# Patient Record
Sex: Male | Born: 1935 | Race: White | Hispanic: No | Marital: Single | Smoking: Never smoker
Health system: Southern US, Community
[De-identification: ages and names within clinical notes are randomized; demographics above are authoritative.]

## PROBLEM LIST (undated history)

## (undated) DIAGNOSIS — J310 Chronic rhinitis: Secondary | ICD-10-CM

## (undated) DIAGNOSIS — G309 Alzheimer's disease, unspecified: Secondary | ICD-10-CM

## (undated) DIAGNOSIS — N401 Enlarged prostate with lower urinary tract symptoms: Secondary | ICD-10-CM

## (undated) DIAGNOSIS — K219 Gastro-esophageal reflux disease without esophagitis: Secondary | ICD-10-CM

## (undated) DIAGNOSIS — M75122 Complete rotator cuff tear or rupture of left shoulder, not specified as traumatic: Secondary | ICD-10-CM

## (undated) DIAGNOSIS — R110 Nausea: Secondary | ICD-10-CM

## (undated) DIAGNOSIS — N138 Other obstructive and reflux uropathy: Secondary | ICD-10-CM

## (undated) DIAGNOSIS — F0280 Dementia in other diseases classified elsewhere without behavioral disturbance: Secondary | ICD-10-CM

## (undated) DIAGNOSIS — H8110 Benign paroxysmal vertigo, unspecified ear: Secondary | ICD-10-CM

## (undated) HISTORY — DX: Nausea: R11.0

## (undated) HISTORY — DX: Other obstructive and reflux uropathy: N13.8

## (undated) HISTORY — DX: Alzheimer's disease, unspecified: G30.9

## (undated) HISTORY — DX: Benign paroxysmal vertigo, unspecified ear: H81.10

## (undated) HISTORY — DX: Dementia in other diseases classified elsewhere without behavioral disturbance: F02.80

## (undated) HISTORY — DX: Chronic rhinitis: J31.0

## (undated) HISTORY — DX: Complete rotator cuff tear or rupture of left shoulder, not specified as traumatic: M75.122

## (undated) HISTORY — DX: Benign prostatic hyperplasia with lower urinary tract symptoms: N40.1

## (undated) HISTORY — DX: Gastro-esophageal reflux disease without esophagitis: K21.9

---

## 2011-01-03 ENCOUNTER — Ambulatory Visit: Payer: Self-pay | Admitting: Gastroenterology

## 2011-11-20 ENCOUNTER — Emergency Department: Payer: Self-pay | Admitting: Emergency Medicine

## 2012-03-21 ENCOUNTER — Emergency Department: Payer: Self-pay | Admitting: Emergency Medicine

## 2012-03-23 LAB — BETA STREP CULTURE(ARMC)

## 2012-10-30 ENCOUNTER — Observation Stay: Payer: Self-pay | Admitting: Internal Medicine

## 2012-10-30 LAB — COMPREHENSIVE METABOLIC PANEL
Albumin: 3.7 g/dL (ref 3.4–5.0)
Alkaline Phosphatase: 73 U/L (ref 50–136)
Anion Gap: 7 (ref 7–16)
BUN: 13 mg/dL (ref 7–18)
Bilirubin,Total: 0.5 mg/dL (ref 0.2–1.0)
Co2: 27 mmol/L (ref 21–32)
Creatinine: 0.9 mg/dL (ref 0.60–1.30)
EGFR (Non-African Amer.): >60
Glucose: 161 mg/dL — ABNORMAL HIGH (ref 65–99)
Osmolality: 270 (ref 275–301)
SGOT(AST): 17 U/L (ref 15–37)
Sodium: 133 mmol/L — ABNORMAL LOW (ref 136–145)
Total Protein: 6.8 g/dL (ref 6.4–8.2)

## 2012-10-30 LAB — TROPONIN I: Troponin-I: 0.1 ng/mL — ABNORMAL HIGH

## 2012-10-30 LAB — CK TOTAL AND CKMB (NOT AT ARMC)
CK, Total: 43 U/L (ref 35–232)
CK-MB: 1.9 ng/mL (ref 0.5–3.6)
CK-MB: 1.8 ng/mL (ref 0.5–3.6)

## 2012-10-30 LAB — CBC WITH DIFFERENTIAL/PLATELET
Eosinophil %: 0.3 %
HCT: 39.2 % — ABNORMAL LOW (ref 40.0–52.0)
MCH: 34.2 pg — ABNORMAL HIGH (ref 26.0–34.0)
MCV: 97 fL (ref 80–100)
Monocyte #: 0.3 x10 3/mm (ref 0.2–1.0)
Monocyte %: 4.4 %
Neutrophil #: 6.2 10*3/uL (ref 1.4–6.5)
RDW: 12.9 % (ref 11.5–14.5)
WBC: 7.6 10*3/uL (ref 3.8–10.6)

## 2012-10-31 LAB — BASIC METABOLIC PANEL
Anion Gap: 6 — ABNORMAL LOW (ref 7–16)
BUN: 10 mg/dL (ref 7–18)
Chloride: 101 mmol/L (ref 98–107)
Co2: 27 mmol/L (ref 21–32)
Creatinine: 0.69 mg/dL (ref 0.60–1.30)
Osmolality: 267 (ref 275–301)
Sodium: 134 mmol/L — ABNORMAL LOW (ref 136–145)

## 2012-10-31 LAB — CBC WITH DIFFERENTIAL/PLATELET
Basophil #: 0 10*3/uL (ref 0.0–0.1)
HGB: 13.7 g/dL (ref 13.0–18.0)
Lymphocyte #: 1.3 10*3/uL (ref 1.0–3.6)
MCH: 33.6 pg (ref 26.0–34.0)
MCV: 95 fL (ref 80–100)
Monocyte #: 0.6 x10 3/mm (ref 0.2–1.0)
Platelet: 175 10*3/uL (ref 150–440)
RDW: 12.8 % (ref 11.5–14.5)
WBC: 7.1 10*3/uL (ref 3.8–10.6)

## 2012-10-31 LAB — LIPID PANEL
Cholesterol: 141 mg/dL (ref 0–200)
HDL Cholesterol: 55 mg/dL (ref 40–60)
Triglycerides: 77 mg/dL (ref 0–200)

## 2012-10-31 LAB — URINALYSIS, COMPLETE
Bacteria: NONE SEEN
Glucose,UR: NEGATIVE mg/dL (ref 0–75)
Nitrite: NEGATIVE
Ph: 7 (ref 4.5–8.0)
Protein: NEGATIVE
RBC,UR: NONE SEEN /HPF (ref 0–5)
Squamous Epithelial: NONE SEEN

## 2012-10-31 LAB — CK TOTAL AND CKMB (NOT AT ARMC)
CK, Total: 61 U/L (ref 35–232)
CK-MB: 2.9 ng/mL (ref 0.5–3.6)

## 2012-10-31 LAB — TSH: Thyroid Stimulating Horm: 6.14 u[IU]/mL — ABNORMAL HIGH

## 2012-10-31 LAB — T4, FREE: Free Thyroxine: 1.04 ng/dL (ref 0.76–1.46)

## 2013-03-10 ENCOUNTER — Ambulatory Visit: Payer: Self-pay | Admitting: Orthopedic Surgery

## 2013-03-10 LAB — CBC WITH DIFFERENTIAL/PLATELET
Basophil #: 0 10*3/uL (ref 0.0–0.1)
Basophil %: 1.4 %
Eosinophil %: 3.6 %
HCT: 41.3 % (ref 40.0–52.0)
Lymphocyte #: 0.9 10*3/uL — ABNORMAL LOW (ref 1.0–3.6)
Lymphocyte %: 27.5 %
MCH: 34.1 pg — ABNORMAL HIGH (ref 26.0–34.0)
MCHC: 35.1 g/dL (ref 32.0–36.0)
Monocyte #: 0.3 x10 3/mm (ref 0.2–1.0)
Monocyte %: 8.2 %
Neutrophil #: 2 10*3/uL (ref 1.4–6.5)
Neutrophil %: 59.3 %
Platelet: 193 10*3/uL (ref 150–440)
RDW: 12.9 % (ref 11.5–14.5)

## 2013-03-10 LAB — BASIC METABOLIC PANEL
Anion Gap: 4 — ABNORMAL LOW (ref 7–16)
Chloride: 105 mmol/L (ref 98–107)
Co2: 29 mmol/L (ref 21–32)
Creatinine: 0.88 mg/dL (ref 0.60–1.30)
EGFR (African American): >60
Glucose: 114 mg/dL — ABNORMAL HIGH (ref 65–99)
Osmolality: 278 (ref 275–301)
Potassium: 4.1 mmol/L (ref 3.5–5.1)

## 2013-03-11 ENCOUNTER — Ambulatory Visit: Payer: Self-pay | Admitting: Orthopedic Surgery

## 2013-06-03 HISTORY — PX: CARPAL TUNNEL RELEASE: SHX101

## 2014-01-27 ENCOUNTER — Ambulatory Visit: Payer: Self-pay | Admitting: Podiatry

## 2014-08-17 ENCOUNTER — Ambulatory Visit: Payer: Self-pay | Admitting: Ophthalmology

## 2014-08-23 ENCOUNTER — Emergency Department: Payer: Self-pay | Admitting: Emergency Medicine

## 2014-09-23 NOTE — Consult Note (Signed)
Brief Consult Note: Diagnosis: Borderline elevated troponin, no CP, normal ECG, probable demand supply ischemia without MI.   Patient was seen by consultant.   Consult note dictated.   Comments: REC  Agree with current therapy, defer full dose anticoagulation, defer further cardiac diagnostics at this time.  Electronic Signatures: Marcina MillardParaschos, Dacota Ruben (MD)  (Signed 30-May-14 17:09)  Authored: Brief Consult Note   Last Updated: 30-May-14 17:09 by Marcina MillardParaschos, Danasha Melman (MD)

## 2014-09-23 NOTE — Discharge Summary (Signed)
PATIENT NAME:  [name], [name] DATE OF BIRTH:  November 22, 1932  DATE OF ADMISSION:  10/30/2012 DATE OF DISCHARGE:  10/31/2012  ADMITTING PHYSICIAN: Dr. Jacques NavyAhmadzia.  DISCHARGING PHYSICIAN: Cherly Hensenadhika Palisade, M.D.   PATIENT'S PRIMARY MD: Dr. Alonna BucklerAndrew Lamb.   CONSULTATIONS IN THE HOSPITAL: Cardiology consultation by Dr. Darrold JunkerParaschos.   DISCHARGE DIAGNOSES: 1.  NAUSEA AND VOMITING, DEVELOPED AS A SIDE-EFFECT TO TRAMADOL.  2.  Elevated troponin, likely demand ischemia.  3.  Gastroesophageal reflux disease.  4.  Benign prostatic hypertrophy.  5.  Carpal tunnel surgery.   DISCHARGE MEDICATIONS: 1.  Jalyn  0.5 mg/0.4 mg, one capsule p.o. daily.  2.  Nexium 40 mg p.o. daily.  3.  Aspirin 81 mg p.o. daily.  4.  Tylenol 1000 mg every 6 hours as needed for pain or fever.  5.  Zofran 4 mg oral disintegrating tablet every 6 hours as needed for nausea or vomiting.   DISCHARGE DIET: Regular diet.   DISCHARGE ACTIVITY: As tolerated.   FOLLOWUP INSTRUCTIONS: 1.  PCP followup in 4 to 6 weeks.  2.  Follow up with Dr. Rosita KeaMenz in 5 days for followup of right carpal tunnel surgery.  3.  Follow up with Dr. Darrold JunkerParaschos in 3 weeks if needed, for followup of echo.  LABS AND IMAGING STUDIES PRIOR TO DISCHARGE:  Echo Doppler showing normal LV ejection fraction, 50% to 55%, low-normal global left ventricular systolic function, mild mitral valve regurgitation, mild-to-moderate aortic regurgitation and mild-to-moderate tricuspid regurgitation.   Urinalysis: Negative for any infection.   WBC 7.1, hemoglobin 13.7, hematocrit 38.5, platelet count 175.   Sodium 134, potassium 4.3, chloride 101, bicarb 27, BUN 10, creatinine 0.69, and glucose of 101, calcium of 8.6, magnesium 1.8. HbA1c 6.1.   LDL cholesterol 71, HDL 55, triglycerides 77. Total cholesterol 141. TSH elevated slightly at 6.14,  but free T4 was normal at 1.04.  Troponins were elevated at 0.1 and 0.12, with normal CK and CK-MB.   BRIEF HOSPITAL  COURSE: Mr. [name] is a very pleasant 81 year old Caucasian male with a past medical history significant for benign prostatic hypertrophy and gastroesophageal reflux disease who had right carpal tunnel surgery done by Dr. Rosita KeaMenz 2 days prior to the admission and was started on tramadol as needed for pain.   AFTER TAKING 3 PILLS OF TRAMADOL PATIENT THE EXPERIENCED SEVERE NAUSEA AND VOMITING, AND HE HAD SIMILAR SYMPTOMS SEVERAL YEARS AGO SECONDARY TO PERCOCET MEDICATION.   So he presented to the ER and was found to have a mildly elevated troponin and so was admitted for the same.   1.  Nausea and vomiting secondary to tramadol side-effects: After stopping the medication after several hours the patient's nausea and vomiting have improved and he was able to tolerate a regular diet. So he was advised to stay away from tramadol since his pain from the carpal tunnel surgery was minimal. He was advised to just take Motrin or Tylenol over-the-counter for his pain. He has a followup visit with Dr. Rosita KeaMenz for taking out the sutures in 5 days, which he was advised to keep.  2. Elevated troponins, likely demand ischemia: His echo showed a normal EF, 50% to 55%, with no wall motion abnormalities. His troponins plateaued, and he was seen by cardiologist,  Dr. Darrold JunkerParaschos, who did not feel that this was an acute MI and it was likely demand ischemia.  3.  Nexium and Jalyn were continued for his GERD and BPH. His course has been otherwise uneventful in the hospital.  DISCHARGE CONDITION: Stable.   DISCHARGE DISPOSITION: Home.   Time spent on discharge was 45 minutes.   ____________________________ Enid Baas, MD rk:dm D: 11/02/2012 10:33:49 ET T: 11/02/2012 11:31:36 ET JOB#: [id number]  cc: Enid Baas, MD, <Dictator> Reola Mosher. Randa Lynn, MD Enid Baas MD ELECTRONICALLY SIGNED 11/23/2012 20:36

## 2014-09-23 NOTE — Op Note (Signed)
PATIENT NAME:  [name], [name] MR#:  [id number] DATE OF BIRTH:  1932-11-02  DATE OF PROCEDURE:  03/11/2013  PREOPERATIVE DIAGNOSIS: Recurrent right carpal tunnel syndrome.   POSTOPERATIVE DIAGNOSIS:  Recurrent right carpal tunnel syndrome.   PROCEDURE: Release right carpal tunnel.   ANESTHESIA: General.   SURGEON: Kennedy BuckerMichael Tyrease Vandeberg, M.D.   DESCRIPTION OF PROCEDURE: The patient was brought to the operating room and after adequate anesthesia was attained, the right arm was prepped and draped in the usual sterile fashion with a tourniquet applied to the upper arm. After the patient identification and timeout procedures were completed, the tourniquet was raised to 250 mmHg. An incision was made slightly ulnar to the previous incision to get into fresh tissue. It was also extended across the wrist with a Bruner-type incision for better exposure proximally and exposure of the antebrachial fascia. The nerve was exposed proximally first and antebrachial fascia released. Going distally, release was carried out along the ulnar border of the carpal tunnel. Elevating this flap did show that the median nerve was adherent to the overlying scar tissue. It was meticulously dissected off of this, and with microscissors, neurolysis was carried out, as there was scar tissue that appeared to be constricting the nerve. After release along the volar surface, and then posteriorly as well to make sure there is no constricting band, the nerve appeared to be good adequately decompressed, and the area of constriction previously noted by scar appeared to have been relieved. The wound was irrigated, and 10 mL of 0.5% Sensorcaine was infiltrated subcutaneously.  The wound was then closed with simple interrupted 4-0 nylon. A sterile compressive dressing with Xeroform, 4 x 4's, Webril and an Ace wrap were applied, and the patient was sent to the recovery room in stable condition.   ESTIMATED BLOOD LOSS: Minimal.   COMPLICATIONS: None.    SPECIMEN: None.   TOURNIQUET TIME: 27 minutes at 250 mmHg.     ____________________________ Justin SchullerMichael J. Haily Caley, MD mjm:dmm D: 03/11/2013 18:59:03 ET T: 03/11/2013 22:34:53 ET JOB#: [id number]  cc: Justin SchullerMichael J. Francis Doenges, MD, <Dictator> Justin SchullerMICHAEL J Prentiss Polio MD ELECTRONICALLY SIGNED 03/12/2013 7:59

## 2014-09-23 NOTE — Consult Note (Signed)
PATIENT NAME:  [name], Justin MR#:  [id number] DATE OF BIRTH:  Sep 10, 1932  DATE OF CONSULTATION:  10/30/2012  REFERRING PHYSICIAN:  Krystal EatonShayiq Ahmadzia, MD CONSULTING PHYSICIAN:  Marcina MillardAlexander Nikisha Fleece, MD  PRIMARY CARE PHYSICIAN: Dr. Randa LynnLamb.   CHIEF COMPLAINT: Nausea and vomiting.   HISTORY OF PRESENT ILLNESS: The patient is a 81 year old gentleman referred for evaluation of borderline elevated troponin. The patient recently had a carpal tunnel release surgery by Dr. Rosita KeaMenz. The patient was discharged home with pain medications. The patient developed nausea and vomiting several times the day prior to admission. He came to the hospital where he denied chest pain. The patient had borderline elevated troponin and was admitted for further evaluation. The patient denies chest pain today. EKG is normal with sinus bradycardia.   PAST MEDICAL HISTORY:  1.  Carpal tunnel release surgery per Dr. Rosita KeaMenz two days prior to admission. 2.  Gastroesophageal reflux disease.  3.  BPH.   MEDICATIONS:  Aspirin 81 mg daily. Marland Kitchen. Nexium 40 mg daily. Tramadol 50 mg 1 to 2 q.i.d. p.r.n.   SOCIAL HISTORY: The patient is active, still works part-time. Denies tobacco or EtOH abuse.   FAMILY HISTORY: No immediate family history for coronary artery disease or myocardial infarction.   REVIEW OF SYSTEMS:  CONSTITUTIONAL: No fever or chills.  EYES: No blurry vision.  EARS: No hearing loss.  RESPIRATORY: No shortness of breath.  CARDIOVASCULAR: No chest pain.  GASTROINTESTINAL: The patient has had nausea and vomiting following pain medications.  GENITOURINARY: No dysuria or hematuria.  ENDOCRINE: No polyuria or polydipsia.  HEMATOLOGICAL: No easy bruising or bleeding.  MUSCULOSKELETAL: No arthralgias or myalgias.  NEUROLOGICAL: No focal muscle weakness or numbness.  PSYCHOLOGICAL: No depression or anxiety.   PHYSICAL EXAMINATION:  VITAL SIGNS: Blood pressure 128/66, pulse 60, respirations 22, temperature 97.5, pulse oximetry  100%.  HEENT: Pupils equal, reactive to light and accommodation.  NECK: Supple without thyromegaly.  LUNGS: Clear.  HEART: Normal JVP. Normal PMI. Regular rate and rhythm. Normal S1, S2. No appreciable gallop, murmur, or rub.  ABDOMEN: Soft and nontender. Pulses were intact bilaterally.  MUSCULOSKELETAL: Normal muscle tone.  NEUROLOGIC: The patient is alert and oriented x3. Motor and sensory both grossly intact.   IMPRESSION: This is a 81 year old gentleman who presents with nausea and vomiting after taking pain medications, noted to have borderline elevated troponin in the absence of chest pain or ECG changes. This likely represents demand/supply ischemia without non-ST elevation myocardial infarction.   RECOMMENDATIONS:  1. Agree with current therapy.  2. Would defer full dose anticoagulation.  3. Continue to cycle cardiac enzymes for now.  4. Defer full dose anticoagulation.  5. Defer any further noninvasive or invasive cardiac evaluation at this time.  ____________________________ Marcina MillardAlexander Chieko Neises, MD ap:ap D: 10/30/2012 17:07:27 ET T: 10/30/2012 21:35:30 ET JOB#: [id number]  cc: Marcina MillardAlexander Amandine Covino, MD, <Dictator> Marcina MillardALEXANDER Asees Manfredi MD ELECTRONICALLY SIGNED 11/09/2012 8:48

## 2014-09-23 NOTE — H&P (Signed)
PATIENT NAME:  [name], [name] MR#:  [id number] DATE OF BIRTH:  November 10, 1932  DATE OF ADMISSION:  10/30/2012  REFERRING PHYSICIAN:  Doug SouSam Jacubowitz, MD   PRIMARY CARE PHYSICIAN:  Alonna BucklerAndrew Lamb, MD  CHIEF COMPLAINT: Nausea, vomiting.   HISTORY OF PRESENT ILLNESS: The patient is a pleasant 81 year old male with history of GERD and BPH who underwent carpal tunnel release surgery on Wednesday at Dr. Dema SeverinMendez's office.  He was discharged with some pain medication, which he took yesterday. Soon after he started to have nausea and vomiting. This was about 4 or 5 times yesterday and about 2 times today.  That is why he came into the hospital. While here he did not have any pains in the chest, shortness of breath or pains in the abdomen. He denies having any kinds of pain. He was noticed to have a positive troponin of 0.13 and therefore hospitalist services were contacted for further evaluation and management. He was given a dose of aspirin.  PAST MEDICAL HISTORY:  1.  Carpal tunnel release a couple of days ago at Dr. Neomia GlassMenz's office. 2.  GERD. 3.  BPH. 4.  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  PAST SURGICAL HISTORY:    1.  Finger surgery on the left hand prior.  2.  History of carpal tunnel release on Wednesday.   SOCIAL HISTORY: No tobacco, alcohol or drug use. Still works part-time.   ALLERGIES: IODINATED RADIOCONTRAST DYE.   FAMILY HISTORY: Denies having any family history of hypertension, diabetes, cancers or premature CAD.   OUTPATIENT MEDICATIONS:  1.  Aspirin 81 mg daily. 2.  Jalyn 0.5/0.4 mg 1 cap once a day. 3.  Nexium 40 mg daily. 4.  Tramadol 50 mg 1 to 2 tabs 4 times a day as needed for pain, which was started Wednesday.   REVIEW OF SYSTEMS: CONSTITUTIONAL: No fever, fatigue, weakness or weight changes.  EYES: No blurry vision or double vision.  ENT: No tinnitus, hearing loss or allergies currently. The patient does have some sinus issues and allergies  chronically.  RESPIRATORY: No cough, wheezing, shortness of breath, dyspnea on exertion or COPD. CARDIOVASCULAR: Denies chest pain, orthopnea, swelling in the legs, edema or hypertension. Denies palpitations, but has an irregular heartbeat of a kind which he does not know what and was told it was congenital. GASTROINTESTINAL: Positive for nausea and vomiting. No abdominal pain, hematemesis, melena or ulcers. Has GERD. GENITOURINARY: Has BPH.   HEME/LYMPH:  No anemia or easy bruising.  SKIN: No rashes.  MUSCULOSKELETAL: Denies arthritis or gout.  NEUROLOGIC: Denies focal weakness, numbness, TIA or seizures.  PSYCHIATRIC: Denies anxiety or insomnia.   PHYSICAL EXAMINATION: VITAL SIGNS: Temperature on arrival 97.6, pulse rate 65 but while I was in the room was in the low 50s, heart rate 18, blood pressure on arrival 165/73, last blood pressure 132/64 and O2 sat on arrival 98% on room air.  GENERAL: The patient is a well-developed Caucasian male lying in bed in no obvious distress.  HEENT: Normocephalic, atraumatic. Pupils are equal and reactive. Anicteric sclerae. Extraocular muscles are intact. Moist mucous membranes.  NECK: Supple. No JVD. No thyroid tenderness.  HEART:  S1, S2.  Bradycardic. No murmurs, rubs or gallops.  LUNGS: Clear to auscultation. No wheezing, rhonchi or rales.  ABDOMEN: Soft, nontender. Positive bowel sounds in all quadrants. No organomegaly appreciated.  SKIN: No obvious rashes.  MUSCULOSKELETAL: No focal tenderness. NEUROLOGIC:  Cranial nerves II through XII grossly intact. Strength is  5/5 in all extremities.  PSYCHIATRIC: Awake, alert and oriented x 3. Cooperative, pleasant, conversant.   LABORATORY AND DIAGNOSTICS:  Sodium 133, potassium 3.4, BUN 13, creatinine 0.9. LFTs within normal limits. Troponin 0.13.  Hemoglobin 13.9, platelets 156, WBC 7.6.   EKG: Sinus bradycardia, rate is 54 without any acute ST changes or T wave inversions.   ASSESSMENT AND PLAN: We  have a pleasant 81 year old male with history of gastroesophageal reflux disease, benign prostatic hypertrophy and carpal tunnel release surgery who just took pain medication, tramadol, starting yesterday and developed nausea and vomiting. The nausea and vomiting likely is secondary to Ultram as it is a common side effect from it. We would stop that and start the patient on Zofran p.r.n. and symptomatic relief.  Would cycle the troponins. I believe that elevation of troponin is not cardiac at this point as he has no chest pains or typical or atypical chest or lung complaints at all. He actually came in for his nausea and vomiting and the troponin elevation was an incidental finding. There are no acute EKG changes. He has been given aspirin, which I would continue. Would not give him any beta blocker as he is already having bradycardia. Would check TSH, lipid profile and Hemoglobin A1c for risk stratification and consult with cardiology. Cycle his troponins and order an echocardiogram. See what cardiology recommend. We would continue his  gastroesophageal reflux disease medications, check the lipid profile as well. Start him on heparin for deep vein thrombosis prophylaxis.   TOTAL TIME SPENT: 40 minutes. ____________________________ Krystal Eaton, MD sa:sb D: 10/30/2012 10:39:41 ET T: 10/30/2012 11:02:41 ET JOB#: [id number]  cc: Krystal Eaton, MD, <Dictator> Reola Mosher. Randa Lynn, MD Krystal Eaton MD ELECTRONICALLY SIGNED 11/27/2012 20:56

## 2014-10-05 DIAGNOSIS — M75122 Complete rotator cuff tear or rupture of left shoulder, not specified as traumatic: Secondary | ICD-10-CM

## 2014-10-05 HISTORY — DX: Complete rotator cuff tear or rupture of left shoulder, not specified as traumatic: M75.122

## 2015-06-21 DIAGNOSIS — K219 Gastro-esophageal reflux disease without esophagitis: Secondary | ICD-10-CM | POA: Diagnosis not present

## 2015-06-21 DIAGNOSIS — G4700 Insomnia, unspecified: Secondary | ICD-10-CM | POA: Diagnosis not present

## 2015-06-21 DIAGNOSIS — J310 Chronic rhinitis: Secondary | ICD-10-CM | POA: Diagnosis not present

## 2015-06-21 DIAGNOSIS — N401 Enlarged prostate with lower urinary tract symptoms: Secondary | ICD-10-CM | POA: Diagnosis not present

## 2015-07-17 DIAGNOSIS — R110 Nausea: Secondary | ICD-10-CM | POA: Diagnosis not present

## 2015-07-23 ENCOUNTER — Emergency Department
Admission: EM | Admit: 2015-07-23 | Discharge: 2015-07-23 | Disposition: A | Discharge status: Home / Self Care | Payer: PPO | Attending: Emergency Medicine | Admitting: Emergency Medicine

## 2015-07-23 DIAGNOSIS — G43A0 Cyclical vomiting, not intractable: Secondary | ICD-10-CM | POA: Diagnosis not present

## 2015-07-23 DIAGNOSIS — R1110 Vomiting, unspecified: Secondary | ICD-10-CM

## 2015-07-23 DIAGNOSIS — R05 Cough: Secondary | ICD-10-CM | POA: Diagnosis not present

## 2015-07-23 DIAGNOSIS — R112 Nausea with vomiting, unspecified: Secondary | ICD-10-CM | POA: Diagnosis not present

## 2015-07-23 LAB — CBC
HCT: 41.9 % (ref 40.0–52.0)
HEMOGLOBIN: 14.7 g/dL (ref 13.0–18.0)
MCH: 34.2 pg — AB (ref 26.0–34.0)
MCHC: 35.1 g/dL (ref 32.0–36.0)
MCV: 97.4 fL (ref 80.0–100.0)
Platelets: 206 10*3/uL (ref 150–440)
RBC: 4.3 MIL/uL — AB (ref 4.40–5.90)
RDW: 13 % (ref 11.5–14.5)
WBC: 6.1 10*3/uL (ref 3.8–10.6)

## 2015-07-23 LAB — COMPREHENSIVE METABOLIC PANEL
ALK PHOS: 61 U/L (ref 38–126)
ALT: 15 U/L — ABNORMAL LOW (ref 17–63)
ANION GAP: 9 (ref 5–15)
AST: 26 U/L (ref 15–41)
Albumin: 4.4 g/dL (ref 3.5–5.0)
BUN: 12 mg/dL (ref 6–20)
CALCIUM: 9 mg/dL (ref 8.9–10.3)
CO2: 26 mmol/L (ref 22–32)
Chloride: 96 mmol/L — ABNORMAL LOW (ref 101–111)
Creatinine, Ser: 0.81 mg/dL (ref 0.61–1.24)
GFR calc non Af Amer: >60 mL/min (ref >60)
Glucose, Bld: 123 mg/dL — ABNORMAL HIGH (ref 65–99)
Potassium: 3.9 mmol/L (ref 3.5–5.1)
SODIUM: 131 mmol/L — AB (ref 135–145)
Total Bilirubin: 0.7 mg/dL (ref 0.3–1.2)
Total Protein: 7 g/dL (ref 6.5–8.1)

## 2015-07-23 LAB — URINALYSIS COMPLETE WITH MICROSCOPIC (ARMC ONLY)
Bacteria, UA: NONE SEEN
Bilirubin Urine: NEGATIVE
Glucose, UA: NEGATIVE mg/dL
HGB URINE DIPSTICK: NEGATIVE
LEUKOCYTES UA: NEGATIVE
NITRITE: NEGATIVE
PH: 5 (ref 5.0–8.0)
PROTEIN: NEGATIVE mg/dL
Specific Gravity, Urine: 1.017 (ref 1.005–1.030)

## 2015-07-23 LAB — TROPONIN I

## 2015-07-23 LAB — LIPASE, BLOOD: LIPASE: 23 U/L (ref 11–51)

## 2015-07-23 MED ORDER — METOCLOPRAMIDE HCL 5 MG/ML IJ SOLN
INTRAMUSCULAR | Status: AC
Start: 2015-07-23 — End: 2015-07-23
  Administered 2015-07-23: 10 mg via INTRAVENOUS
  Filled 2015-07-23: qty 2

## 2015-07-23 MED ORDER — PROMETHAZINE HCL 12.5 MG PO TABS: 12.5000 mg | ORAL_TABLET | Freq: Four times a day (QID) | ORAL | Status: DC | PRN

## 2015-07-23 MED ORDER — ONDANSETRON HCL 4 MG/2ML IJ SOLN
INTRAMUSCULAR | Status: DC
Start: 2015-07-23 — End: 2015-07-24
  Filled 2015-07-23: qty 4

## 2015-07-23 MED ORDER — SODIUM CHLORIDE 0.9 % IV SOLN
8.0000 mg | Freq: Once | INTRAVENOUS | Status: AC
  Administered 2015-07-23: 8 mg via INTRAVENOUS
  Filled 2015-07-23: qty 4

## 2015-07-23 MED ORDER — METOCLOPRAMIDE HCL 5 MG/ML IJ SOLN
10.0000 mg | Freq: Once | INTRAMUSCULAR | Status: AC
  Administered 2015-07-23: 10 mg via INTRAVENOUS

## 2015-07-23 MED ORDER — SODIUM CHLORIDE 0.9 % IV SOLN
1000.0000 mL | Freq: Once | INTRAVENOUS | Status: AC
  Administered 2015-07-23: 1000 mL via INTRAVENOUS

## 2015-07-23 NOTE — ED Provider Notes (Addendum)
[facility] Emergency Department Provider Note  ____________________________________________    I have reviewed the triage vital signs and the nursing notes.   HISTORY  Chief Complaint Emesis    HPI [name] is a 82 y.o. male who presents with complaints of nausea and vomiting. Patient reports he has been feeling nauseous the last 3 or 4 days. Today he started vomiting. Vomitus has been nonbilious and nonbloody but severe. He has no chest pain. No diaphoresis. No abdominal pain. No back pain. Denies recent travel. This is never happened to him before.     No past medical history on file.  There are no active problems to display for this patient.   No past surgical history on file.  No current outpatient prescriptions on file.  Allergies Iodine; Oxycodone; Tramadol; and Caffeine  No family history on file.  Social History Social History  Substance Use Topics  . Smoking status: Not on file  . Smokeless tobacco: Not on file  . Alcohol Use: Not on file   patient does not smoke and does not drink  Review of Systems  Constitutional: Negative for fever. Eyes: Negative for visual changes. ENT: Negative for sore throat Cardiovascular: Negative for chest pain. Respiratory: Negative for shortness of breath. For cough Gastrointestinal: Negative for abdominal pain Genitourinary: Negative for dysuria. Musculoskeletal: Negative for back pain. Skin: Negative for rash. Neurological: Negative for headaches  Psychiatric: No anxiety    ____________________________________________   PHYSICAL EXAM:  VITAL SIGNS: ED Triage Vitals  Enc Vitals Group     BP 07/23/15 1735 156/73 mmHg     Pulse Rate 07/23/15 1735 58     Resp 07/23/15 1735 18     Temp 07/23/15 1735 97.8 F (36.6 C)     Temp Source 07/23/15 1735 Oral     SpO2 07/23/15 1735 97 %     Weight 07/23/15 1735 138 lb (62.596 kg)     Height 07/23/15 1735  (1.727 m)     Head Cir --       Peak Flow --      Pain Score 07/23/15 1737 0     Pain Loc --      Pain Edu? --      Excl. in GC? --      Constitutional: Alert and oriented. Well appearing and in no distress. Eyes: Conjunctivae are normal.  ENT   Head: Normocephalic and atraumatic.   Mouth/Throat: Mucous membranes are moist. Cardiovascular: Normal rate, regular rhythm. Normal and symmetric distal pulses are present in all extremities. No murmurs, rubs, or gallops. Respiratory: Normal respiratory effort without tachypnea nor retractions. Breath sounds are clear and equal bilaterally.  Gastrointestinal: Soft and non-tender in all quadrants. No distention. There is no CVA tenderness. Genitourinary: deferred Musculoskeletal: Nontender with normal range of motion in all extremities. No lower extremity tenderness nor edema. Neurologic:  Normal speech and language. No gross focal neurologic deficits are appreciated. Skin:  Skin is warm, dry and intact. No rash noted. Psychiatric: Mood and affect are normal. Patient exhibits appropriate insight and judgment.  ____________________________________________    LABS (pertinent positives/negatives)  Labs Reviewed  COMPREHENSIVE METABOLIC PANEL - Abnormal; Notable for the following:    Sodium 131 (*)    Chloride 96 (*)    Glucose, Bld 123 (*)    ALT 15 (*)    All other components within normal limits  CBC - Abnormal; Notable for the following:    RBC 4.30 (*)  MCH 34.2 (*)    All other components within normal limits  URINALYSIS COMPLETEWITH MICROSCOPIC ([facility] ONLY) - Abnormal; Notable for the following:    Color, Urine YELLOW (*)    APPearance CLEAR (*)    Ketones, ur 2+ (*)    Squamous Epithelial / LPF 0-5 (*)    All other components within normal limits  LIPASE, BLOOD  TROPONIN I    ____________________________________________   EKG  ED ECG REPORT I, Jene Every, the attending physician, personally viewed and interpreted this ECG.  Date:  07/23/2015 EKG Time: 5:47 PM Rate: 58 Rhythm: Sinus bradycardia QRS Axis: normal Intervals: normal ST/T Wave abnormalities: normal Conduction Disturbances: none Narrative Interpretation: unremarkable   ____________________________________________    RADIOLOGY I have personally reviewed any xrays that were ordered on this patient: None  ____________________________________________   PROCEDURES  Procedure(s) performed: none  Critical Care performed: none  ____________________________________________   INITIAL IMPRESSION / ASSESSMENT AND PLAN / ED COURSE  Pertinent labs & imaging results that were available during my care of the patient were reviewed by me and considered in my medical decision making (see chart for details).  Patient resents with nausea and vomiting. No abdominal pain. No chest pain. EKG is unremarkable. Labs are normal. We will give IV fluids and Zofran and reevaluate  ----------------------------------------- 10:16 PM on 07/23/2015 -----------------------------------------  Patient is received multiple doses of IV antiemetics but continues to feel nauseous I will admit him to the hospital for observation and further management  Patient called me back  into the room and stated that he would prefer to go home with nausea medication because he does report he is feeling better albeit not perfect. His vital signs are reassuring. His blood tests are normal. He has no headache or focal neuro deficits. He has no vertigo symptoms. I feel discharge is appropriate with strict return precautions ____________________________________________   FINAL CLINICAL IMPRESSION(S) / ED DIAGNOSES  Nausea and vomiting   Jene Every, MD 07/23/15 2217  Jene Every, MD 07/23/15 2229

## 2015-07-23 NOTE — ED Notes (Signed)
Called multiple times for treatment room no answer from lobby or outside.

## 2015-07-23 NOTE — ED Notes (Signed)
Discussed discharge instructions, prescriptions, and follow-up care with patient. No questions or concerns at this time. Pt stable at discharge.  

## 2015-07-23 NOTE — ED Notes (Signed)
Pt states that he started feeling bad last week, was seen at the walk in clinic at that time and placed on zofran, pt states hx of reflux, pt states that this is the worst nausea and vomiting he has ever felt. Pt denies abd pain, denies diarrhea

## 2015-07-23 NOTE — Discharge Instructions (Signed)
Nausea and Vomiting  Nausea means you feel sick to your stomach. Throwing up (vomiting) is a reflex where stomach contents come out of your mouth.  HOME CARE   · Take medicine as told by your doctor.  · Do not force yourself to eat. However, you do need to drink fluids.  · If you feel like eating, eat a normal diet as told by your doctor.    Eat rice, wheat, potatoes, bread, lean meats, yogurt, fruits, and vegetables.    Avoid high-fat foods.  · Drink enough fluids to keep your pee (urine) clear or pale yellow.  · Ask your doctor how to replace body fluid losses (rehydrate). Signs of body fluid loss (dehydration) include:    Feeling very thirsty.    Dry lips and mouth.    Feeling dizzy.    Dark pee.    Peeing less than normal.    Feeling confused.    Fast breathing or heart rate.  GET HELP RIGHT AWAY IF:   · You have blood in your throw up.  · You have black or bloody poop (stool).  · You have a bad headache or stiff neck.  · You feel confused.  · You have bad belly (abdominal) pain.  · You have chest pain or trouble breathing.  · You do not pee at least once every 8 hours.  · You have cold, clammy skin.  · You keep throwing up after 24 to 48 hours.  · You have a fever.  MAKE SURE YOU:   · Understand these instructions.  · Will watch your condition.  · Will get help right away if you are not doing well or get worse.     This information is not intended to replace advice given to you by your health care provider. Make sure you discuss any questions you have with your health care provider.     Document Released: 11/06/2007 Document Revised: 08/12/2011 Document Reviewed: 10/19/2010  Elsevier Interactive Patient Education ©2016 [employer]

## 2015-07-23 NOTE — ED Notes (Signed)
Called pharmacy about zofran IVPB. Pharmacy stated they are sending it ASAP.

## 2015-07-23 NOTE — ED Notes (Signed)
Pt called family member to come and pick him up. Pt to lobby to wait for ride.

## 2015-08-01 ENCOUNTER — Other Ambulatory Visit: Payer: Self-pay | Admitting: Internal Medicine

## 2015-08-01 DIAGNOSIS — R110 Nausea: Secondary | ICD-10-CM | POA: Diagnosis not present

## 2015-08-07 ENCOUNTER — Other Ambulatory Visit: Payer: Self-pay | Admitting: Internal Medicine

## 2015-08-07 DIAGNOSIS — R110 Nausea: Secondary | ICD-10-CM

## 2015-08-08 ENCOUNTER — Ambulatory Visit
Admission: RE | Admit: 2015-08-08 | Discharge: 2015-08-08 | Disposition: A | Discharge status: Home / Self Care | Payer: PPO | Source: Ambulatory Visit | Attending: Internal Medicine | Admitting: Internal Medicine

## 2015-08-08 ENCOUNTER — Ambulatory Visit: Admission: RE | Admit: 2015-08-08 | Payer: PPO | Source: Ambulatory Visit

## 2015-08-08 DIAGNOSIS — R634 Abnormal weight loss: Secondary | ICD-10-CM | POA: Diagnosis not present

## 2015-08-08 DIAGNOSIS — R110 Nausea: Secondary | ICD-10-CM | POA: Diagnosis not present

## 2015-08-08 DIAGNOSIS — K5730 Diverticulosis of large intestine without perforation or abscess without bleeding: Secondary | ICD-10-CM | POA: Diagnosis not present

## 2015-08-08 DIAGNOSIS — I70209 Unspecified atherosclerosis of native arteries of extremities, unspecified extremity: Secondary | ICD-10-CM | POA: Diagnosis not present

## 2015-08-08 MED ORDER — IOHEXOL 300 MG/ML  SOLN
75.0000 mL | Freq: Once | INTRAMUSCULAR | Status: AC | PRN
  Administered 2015-08-08: 75 mL via INTRAVENOUS

## 2015-08-09 ENCOUNTER — Other Ambulatory Visit: Payer: Self-pay | Admitting: Internal Medicine

## 2015-08-09 DIAGNOSIS — R1903 Right lower quadrant abdominal swelling, mass and lump: Secondary | ICD-10-CM

## 2015-08-09 DIAGNOSIS — R110 Nausea: Secondary | ICD-10-CM

## 2015-08-16 DIAGNOSIS — N3289 Other specified disorders of bladder: Secondary | ICD-10-CM | POA: Diagnosis not present

## 2015-08-16 DIAGNOSIS — K689 Other disorders of retroperitoneum: Secondary | ICD-10-CM | POA: Diagnosis not present

## 2015-08-16 DIAGNOSIS — N281 Cyst of kidney, acquired: Secondary | ICD-10-CM | POA: Diagnosis not present

## 2015-08-16 DIAGNOSIS — M545 Low back pain: Secondary | ICD-10-CM | POA: Diagnosis not present

## 2015-08-16 DIAGNOSIS — R110 Nausea: Secondary | ICD-10-CM | POA: Diagnosis not present

## 2015-08-28 DIAGNOSIS — R112 Nausea with vomiting, unspecified: Secondary | ICD-10-CM | POA: Diagnosis not present

## 2015-08-28 DIAGNOSIS — R413 Other amnesia: Secondary | ICD-10-CM | POA: Diagnosis not present

## 2015-08-28 DIAGNOSIS — R42 Dizziness and giddiness: Secondary | ICD-10-CM | POA: Diagnosis not present

## 2015-09-07 DIAGNOSIS — R110 Nausea: Secondary | ICD-10-CM | POA: Diagnosis not present

## 2015-09-21 DIAGNOSIS — R110 Nausea: Secondary | ICD-10-CM | POA: Diagnosis not present

## 2015-09-21 HISTORY — DX: Nausea: R11.0

## 2015-10-18 DIAGNOSIS — G309 Alzheimer's disease, unspecified: Secondary | ICD-10-CM

## 2015-10-18 DIAGNOSIS — H8110 Benign paroxysmal vertigo, unspecified ear: Secondary | ICD-10-CM

## 2015-10-18 DIAGNOSIS — G479 Sleep disorder, unspecified: Secondary | ICD-10-CM | POA: Diagnosis not present

## 2015-10-18 DIAGNOSIS — F0280 Dementia in other diseases classified elsewhere without behavioral disturbance: Secondary | ICD-10-CM | POA: Diagnosis not present

## 2015-10-18 DIAGNOSIS — H8113 Benign paroxysmal vertigo, bilateral: Secondary | ICD-10-CM | POA: Diagnosis not present

## 2015-10-18 HISTORY — DX: Benign paroxysmal vertigo, unspecified ear: H81.10

## 2015-10-18 HISTORY — DX: Dementia in other diseases classified elsewhere, unspecified severity, without behavioral disturbance, psychotic disturbance, mood disturbance, and anxiety: F02.80

## 2015-11-17 DIAGNOSIS — M9901 Segmental and somatic dysfunction of cervical region: Secondary | ICD-10-CM | POA: Diagnosis not present

## 2015-11-17 DIAGNOSIS — M542 Cervicalgia: Secondary | ICD-10-CM | POA: Diagnosis not present

## 2015-11-17 DIAGNOSIS — M9902 Segmental and somatic dysfunction of thoracic region: Secondary | ICD-10-CM | POA: Diagnosis not present

## 2015-11-24 DIAGNOSIS — R7309 Other abnormal glucose: Secondary | ICD-10-CM | POA: Diagnosis not present

## 2015-11-24 DIAGNOSIS — R42 Dizziness and giddiness: Secondary | ICD-10-CM | POA: Diagnosis not present

## 2015-11-24 DIAGNOSIS — R638 Other symptoms and signs concerning food and fluid intake: Secondary | ICD-10-CM | POA: Diagnosis not present

## 2015-11-28 ENCOUNTER — Ambulatory Visit: Payer: Self-pay | Admitting: Urology

## 2015-11-28 DIAGNOSIS — E875 Hyperkalemia: Secondary | ICD-10-CM | POA: Diagnosis not present

## 2015-11-29 ENCOUNTER — Encounter: Payer: Self-pay | Admitting: Urology

## 2015-11-29 ENCOUNTER — Ambulatory Visit (INDEPENDENT_AMBULATORY_CARE_PROVIDER_SITE_OTHER): Payer: PPO | Admitting: Urology

## 2015-11-29 VITALS — BP 148/74 | HR 57 | Ht 69.0 in | Wt 141.0 lb

## 2015-11-29 DIAGNOSIS — R350 Frequency of micturition: Secondary | ICD-10-CM | POA: Diagnosis not present

## 2015-11-29 DIAGNOSIS — J310 Chronic rhinitis: Secondary | ICD-10-CM

## 2015-11-29 DIAGNOSIS — K219 Gastro-esophageal reflux disease without esophagitis: Secondary | ICD-10-CM | POA: Insufficient documentation

## 2015-11-29 DIAGNOSIS — R351 Nocturia: Secondary | ICD-10-CM

## 2015-11-29 DIAGNOSIS — G4700 Insomnia, unspecified: Secondary | ICD-10-CM | POA: Insufficient documentation

## 2015-11-29 DIAGNOSIS — N401 Enlarged prostate with lower urinary tract symptoms: Secondary | ICD-10-CM | POA: Diagnosis not present

## 2015-11-29 DIAGNOSIS — N138 Other obstructive and reflux uropathy: Secondary | ICD-10-CM

## 2015-11-29 HISTORY — DX: Benign prostatic hyperplasia with lower urinary tract symptoms: N40.1

## 2015-11-29 HISTORY — DX: Chronic rhinitis: J31.0

## 2015-11-29 HISTORY — DX: Other obstructive and reflux uropathy: N13.8

## 2015-11-29 HISTORY — DX: Gastro-esophageal reflux disease without esophagitis: K21.9

## 2015-11-29 LAB — BLADDER SCAN AMB NON-IMAGING

## 2015-11-29 NOTE — Patient Instructions (Signed)
Mirabegron extended-release tablets What is this medicine? MIRABEGRON (MIR a BEG ron) is used to treat overactive bladder. This medicine reduces the amount of bathroom visits. It may also help to control wetting accidents. This medicine may be used for other purposes; ask your health care provider or pharmacist if you have questions. What should I tell my health care provider before I take this medicine? They need to know if you have any of these conditions: -difficulty passing urine -high blood pressure -kidney disease -liver disease -an unusual or allergic reaction to mirabegron, other medicines, foods, dyes, or preservatives -pregnant or trying to get pregnant -breast-feeding How should I use this medicine? Take this medicine by mouth with a glass of water. Follow the directions on the prescription label. Do not cut, crush or chew this medicine. You can take it with or without food. If it upsets your stomach, take it with food. Take your medicine at regular intervals. Do not take it more often than directed. Do not stop taking except on your doctor's advice. Talk to your pediatrician regarding the use of this medicine in children. Special care may be needed. Overdosage: If you think you have taken too much of this medicine contact a poison control center or emergency room at once. NOTE: This medicine is only for you. Do not share this medicine with others. What if I miss a dose? If you miss a dose, take it as soon as you can. If it is almost time for your next dose, take only that dose. Do not take double or extra doses. What may interact with this medicine? -certain medicines for bladder problems like fesoterodine, oxybutynin, solifenacin, tolterodine -desipramine -digoxin -flecainide -ketoconazole -MAOIs like Carbex, Eldepryl, Marplan, Nardil, and Parnate -metoprolol -propafenone -thioridazine -warfarin This list may not describe all possible interactions. Give your health care  provider a list of all the medicines, herbs, non-prescription drugs, or dietary supplements you use. Also tell them if you smoke, drink alcohol, or use illegal drugs. Some items may interact with your medicine. What should I watch for while using this medicine? It may take 8 weeks to notice the full benefit from this medicine. You may need to limit your intake tea, coffee, caffeinated sodas, and alcohol. These drinks may make your symptoms worse. Visit your doctor or health care professional for regular checks on your progress. Check your blood pressure as directed. Ask your doctor or health care professional what your blood pressure should be and when you should contact him or her. What side effects may I notice from receiving this medicine? Side effects that you should report to your doctor or health care professional as soon as possible: -allergic reactions like skin rash, itching or hives, swelling of the face, lips, or tongue -chest pain or palpitations -severe or sudden headache -high blood pressure -fast, irregular heartbeat -redness, blistering, peeling or loosening of the skin, including inside the mouth -signs of infection like fever or chills; cough; sore throat; pain or difficulty passing urine -trouble passing urine or change in the amount of urine Side effects that usually do not require medical attention (Report these to your doctor or health care professional if they continue or are bothersome.): -constipation -diarrhea -dizziness -dry eyes -joint pain -mild headache -nausea -runny nose This list may not describe all possible side effects. Call your doctor for medical advice about side effects. You may report side effects to FDA at 1-800-FDA-1088. Where should I keep my medicine? Keep out of the reach of children. Store   at room temperature between 15 and 30 degrees C (59 and 86 degrees F). Throw away any unused medicine after the expiration date. NOTE: This sheet is a  summary. It may not cover all possible information. If you have questions about this medicine, talk to your doctor, pharmacist, or health care provider.    2016, Elsevier/Gold Standard. (2015-01-19 10:22:20)  

## 2015-11-29 NOTE — Progress Notes (Signed)
11/29/2015 10:23 AM   Chen Criss AlvinePrince 14-Apr-1933 [id number]  Referring provider: Morene CrockerZachary E Potter, MD [phone] [id number] [facility] [address] [facility] [facility] [place], [place] [id number]  Chief Complaint  Patient presents with  . Urinary Frequency    New Patient    HPI: Patient is an 82 year old Caucasian male who is referred by his neurologist, Morene CrockerZachary E Potter, M.D., for urinary frequency and BPH.  Patient states he is experiencing urinary frequency, urgency, nocturia, intermittency, hesitancy, straining to urinate and a weak urinary stream.  He states for years he has been told he has an enlarged prostate.  He had been on Jalyn in the past for approximately 2 years and did not find any improvement in his urinary symptoms.  He is aggravated with his urinary symptoms at this time. He states he's expressed his symptoms to several providers and they have not addressed this issue.  I PSS score is 23/6.  His PVR is 49 mL.        IPSS      11/29/15 0900       International Prostate Symptom Score   How often have you had the sensation of not emptying your bladder? About half the time     How often have you had to urinate less than every two hours? About half the time     How often have you found you stopped and started again several times when you urinated? About half the time     How often have you found it difficult to postpone urination? About half the time     How often have you had a weak urinary stream? About half the time     How often have you had to strain to start urination? About half the time     How many times did you typically get up at night to urinate? 5 Times     Total IPSS Score 23     Quality of Life due to urinary symptoms   If you were to spend the rest of your life with your urinary condition just the way it is now how would you feel about that? Terrible        Score:  1-7 Mild 8-19 Moderate 20-35 Severe  He is not experiencing dysuria, suprapubic pain or  gross hematuria. He does not have history of urinary tract infections. He does not have a history of nephrolithiasis. He is not having any recent fevers, chills, nausea or vomiting.  He states he is not consuming caffeine or alcohol.   PMH: Past Medical History  Diagnosis Date  . Chronic rhinitis 11/29/2015    Last Assessment & Plan:  Doing well on antihistamines and nasal steriods.    . Acid reflux 11/29/2015    Last Assessment & Plan:  Taking the ppi with some benefit   . Benign paroxysmal positional nystagmus 10/18/2015  . AD (Alzheimer's disease) 10/18/2015  . Complete rotator cuff rupture of left shoulder 10/05/2014  . Benign prostatic hyperplasia with urinary obstruction 11/29/2015    Last Assessment & Plan:  Worse off meds.   . Feeling bilious 09/21/2015    Overview:  Flairs at times, worse early 2017  Last Assessment & Plan:  Eating better on zofran, intolerant to scopolamine re constipation and urinary retention.      Surgical History: Past Surgical History  Procedure Laterality Date  . Carpal tunnel release  2015    Home Medications:    Medication List  This list is accurate as of: 11/29/15 10:23 AM.  Always use your most recent med list.               CALCIUM + D3 PO  Take by mouth.     esomeprazole 40 MG capsule  Commonly known as:  NEXIUM  Take by mouth.        Allergies:  Allergies  Allergen Reactions  . Iodine Shortness Of Breath  . Oxycodone Nausea Only  . Tramadol Nausea Only  . Caffeine Palpitations    Family History: Family History  Problem Relation Age of Onset  . Kidney cancer Neg Hx   . Prostate cancer Neg Hx     Social History:  reports that he has never smoked. He does not have any smokeless tobacco history on file. He reports that he does not drink alcohol or use illicit drugs.  ROS: UROLOGY Frequent Urination?: Yes Hard to postpone urination?: Yes Burning/pain with urination?: No Get up at night to urinate?: Yes Leakage of  urine?: No Urine stream starts and stops?: Yes Trouble starting stream?: Yes Do you have to strain to urinate?: Yes Blood in urine?: No Urinary tract infection?: No Sexually transmitted disease?: No Injury to kidneys or bladder?: No Painful intercourse?: No Weak stream?: Yes Erection problems?: Yes Penile pain?: No  Gastrointestinal Nausea?: Yes Vomiting?: Yes Indigestion/heartburn?: Yes Diarrhea?: No Constipation?: No  Constitutional Fever: No Night sweats?: No Weight loss?: Yes Fatigue?: Yes  Skin Skin rash/lesions?: No Itching?: No  Eyes Blurred vision?: No Double vision?: Yes  Ears/Nose/Throat Sore throat?: No Sinus problems?: Yes  Hematologic/Lymphatic Swollen glands?: No Easy bruising?: Yes  Cardiovascular Leg swelling?: No Chest pain?: No  Respiratory Cough?: No Shortness of breath?: No  Endocrine Excessive thirst?: Yes  Musculoskeletal Back pain?: Yes Joint pain?: No  Neurological Headaches?: No Dizziness?: Yes  Psychologic Depression?: No Anxiety?: Yes  Physical Exam: BP 148/74 mmHg  Pulse 57  Ht 5\' 9"  (1.753 m)  Wt 141 lb (63.957 kg)  BMI 20.81 kg/m2  Constitutional: Well nourished. Alert and oriented, No acute distress. HEENT: [place] AT, moist mucus membranes. Trachea midline, no masses. Cardiovascular: No clubbing, cyanosis, or edema. Respiratory: Normal respiratory effort, no increased work of breathing. GI: Abdomen is soft, non tender, non distended, no abdominal masses. Liver and spleen not palpable.  No hernias appreciated.  Stool sample for occult testing is not indicated.   GU: No CVA tenderness.  No bladder fullness or masses.  Patient with uncircumcised phallus. Foreskin easily retracted Urethral meatus is patent.  No penile discharge. No penile lesions or rashes. Scrotum without lesions, cysts, rashes and/or edema.  Testicles are located scrotally bilaterally. No masses are appreciated in the testicles. Left and right  epididymis are normal. Rectal: Patient with  normal sphincter tone. Anus and perineum without scarring or rashes. No rectal masses are appreciated. Prostate is approximately 60 grams, irregular, no nodules are appreciated. Seminal vesicles are normal. Skin: No rashes, bruises or suspicious lesions. Lymph: No cervical or inguinal adenopathy. Neurologic: Grossly intact, no focal deficits, moving all 4 extremities. Psychiatric: Normal mood and affect.  Laboratory Data: Lab Results  Component Value Date   WBC 6.1 07/23/2015   HGB 14.7 07/23/2015   HCT 41.9 07/23/2015   MCV 97.4 07/23/2015   PLT 206 07/23/2015    Lab Results  Component Value Date   CREATININE 0.81 07/23/2015     Lab Results  Component Value Date   HGBA1C 6.1 10/31/2012    Lab Results  Component Value Date   TSH 6.14* 10/31/2012       Component Value Date/Time   CHOL 141 10/31/2012 0015   HDL 55 10/31/2012 0015   VLDL 15 10/31/2012 0015   LDLCALC 71 10/31/2012 0015    Lab Results  Component Value Date   AST 26 07/23/2015   Lab Results  Component Value Date   ALT 15* 07/23/2015     Pertinent Imaging: Results for BRENTT, FREAD (MRN [id number]) as of 12/02/2015 16:17  Ref. Range 11/29/2015 09:36  Scan Result Unknown 49ml    Assessment & Plan:    1. Urinary frequency:   Patient's PVR is minimal.  Patient was offered behavioral therapies; bladder training, bladder control strategies, pelvic floor muscle training and fluid management.  He was quite anxious and did not believe any of these treatment modalities would be effective.  We discussed anticholinergic therapy and beta-3 andrenoceptor agonist and the potential side effects of each therapy.   He would like to try the beta-3 andrenoceptor (Myrbetriq).  I have given her Myrbetriq 50 mg samples, #35.  I have reviewed with the patient of the side effects of Myrbetriq, such as: elevation in BP, urinary retention and/or HA.  He will return in three weeks for  PVR and symptom recheck.    - BLADDER SCAN AMB NON-IMAGING  2. Nocturia:   I explained to the patient that nocturia is often multi-factorial and difficult to treat.  Sleeping disorders, heart conditions and peripheral vascular disease, diabetes,  enlarged prostate or urethral stricture causing bladder outlet obstruction and/or certain medications.  He may  benefit from fluid restrictions after 6:00 in the evening and voiding just prior to bedtime.   The patient may also benefit from a discussion with his primary care physician to see if he has risk factors for sleep apnea or other sleep disturbances and obtaining a sleep study.  3. BPH with LUTS  - IPSS score is 23/6   - Continue conservative management, avoiding bladder irritants and timed voiding's  - Medication failure with Jalyn  - Initiate Myrbetriq 50 mg  - RTC in 3 weeks for IPSS and PVR  Return in about 3 weeks (around 12/20/2015) for IPSS and PVR.  These notes generated with voice recognition software. I apologize for typographical errors.  Michiel Cowboy, PA-C  [facility] [address] [place], [place] [address] [phone]

## 2015-12-22 ENCOUNTER — Ambulatory Visit (INDEPENDENT_AMBULATORY_CARE_PROVIDER_SITE_OTHER): Payer: PPO | Admitting: Urology

## 2015-12-22 ENCOUNTER — Encounter: Payer: Self-pay | Admitting: Urology

## 2015-12-22 ENCOUNTER — Telehealth: Payer: Self-pay | Admitting: *Deleted

## 2015-12-22 VITALS — BP 129/73 | HR 68 | Ht 69.0 in | Wt 142.7 lb

## 2015-12-22 DIAGNOSIS — N401 Enlarged prostate with lower urinary tract symptoms: Secondary | ICD-10-CM | POA: Diagnosis not present

## 2015-12-22 DIAGNOSIS — R351 Nocturia: Secondary | ICD-10-CM | POA: Diagnosis not present

## 2015-12-22 DIAGNOSIS — R350 Frequency of micturition: Secondary | ICD-10-CM

## 2015-12-22 DIAGNOSIS — N138 Other obstructive and reflux uropathy: Secondary | ICD-10-CM

## 2015-12-22 LAB — BLADDER SCAN AMB NON-IMAGING: Scan Result: 107

## 2015-12-22 NOTE — Telephone Encounter (Signed)
LMOM for patient to return call about info for PTNS and costs.

## 2015-12-22 NOTE — Progress Notes (Signed)
10:30 AM   [name] Jan 06, 1933 [id number]  Referring provider: Lauro Regulus, MD [address] [facility] [place] [facility] [place], [place] [address]  Chief Complaint  Patient presents with  . Follow-up    Urinary frequency and BPH     HPI: Patient is an 82 year old Caucasian male who presents today to discuss his symptoms after taking Myrbetriq for his urinary frequency.  Background history Patient was referred by his neurologist, Morene Crocker, M.D., for urinary frequency and BPH.  Patient states he is experiencing urinary frequency, urgency, nocturia, intermittency, hesitancy, straining to urinate and a weak urinary stream.  He states for years he has been told he has an enlarged prostate.  He had been on Jalyn in the past for approximately 2 years and did not find any improvement in his urinary symptoms.  He is aggravated with his urinary symptoms at this time. He states he's expressed his symptoms to several providers and they have not addressed this issue.  I PSS score is 23/6.  His PVR is 49 mL.        IPSS      11/29/15 0900 12/22/15 0900     International Prostate Symptom Score   How often have you had the sensation of not emptying your bladder? About half the time Less than 1 in 5    How often have you had to urinate less than every two hours? About half the time More than half the time    How often have you found you stopped and started again several times when you urinated? About half the time About half the time    How often have you found it difficult to postpone urination? About half the time Less than 1 in 5 times    How often have you had a weak urinary stream? About half the time About half the time    How often have you had to strain to start urination? About half the time About half the time    How many times did you typically get up at night to urinate? 5 Times 4 Times    Total IPSS Score 23 19    Quality of Life due to urinary symptoms   If  you were to spend the rest of your life with your urinary condition just the way it is now how would you feel about that? Terrible Unhappy       Score:  1-7 Mild 8-19 Moderate 20-35 Severe  After a three week trial of Myrbetriq, he has found no difference in his urinary symptoms.  He is still experiencing frequency, urgency and nocturia.  He is not experiencing dysuria, suprapubic pain or gross hematuria.  He does not have history of urinary tract infections. He does not have a history of nephrolithiasis.  He is not having any recent fevers, chills, nausea or vomiting.  His IPSS score is 19/5.  His PVR is 107 mL.    He states he is not consuming caffeine or alcohol.   PMH: Past Medical History  Diagnosis Date  . Chronic rhinitis 11/29/2015    Last Assessment & Plan:  Doing well on antihistamines and nasal steriods.    . Acid reflux 11/29/2015    Last Assessment & Plan:  Taking the ppi with some benefit   . Benign paroxysmal positional nystagmus 10/18/2015  . AD (Alzheimer's disease) 10/18/2015  . Complete rotator cuff rupture of left shoulder 10/05/2014  . Benign  prostatic hyperplasia with urinary obstruction 11/29/2015    Last Assessment & Plan:  Worse off meds.   . Feeling bilious 09/21/2015    Overview:  Flairs at times, worse early 2017  Last Assessment & Plan:  Eating better on zofran, intolerant to scopolamine re constipation and urinary retention.      Surgical History: Past Surgical History  Procedure Laterality Date  . Carpal tunnel release  2015    Home Medications:    Medication List       This list is accurate as of: 12/22/15 10:30 AM.  Always use your most recent med list.               CALCIUM + D3 PO  Take by mouth.     esomeprazole 40 MG capsule  Commonly known as:  NEXIUM  Take by mouth.        Allergies:  Allergies  Allergen Reactions  . Iodine Shortness Of Breath  . Oxycodone Nausea Only  . Tramadol Nausea Only  . Caffeine Palpitations     Family History: Family History  Problem Relation Age of Onset  . Kidney cancer Neg Hx   . Prostate cancer Neg Hx     Social History:  reports that he has never smoked. He does not have any smokeless tobacco history on file. He reports that he does not drink alcohol or use illicit drugs.  ROS: UROLOGY Frequent Urination?: Yes Hard to postpone urination?: Yes Burning/pain with urination?: No Get up at night to urinate?: Yes Leakage of urine?: No Urine stream starts and stops?: Yes Trouble starting stream?: No Do you have to strain to urinate?: No Blood in urine?: No Urinary tract infection?: No Sexually transmitted disease?: No Injury to kidneys or bladder?: No Painful intercourse?: No Weak stream?: No Erection problems?: Yes Penile pain?: No  Gastrointestinal Nausea?: No Vomiting?: No Indigestion/heartburn?: No Diarrhea?: No Constipation?: No  Constitutional Fever: No Night sweats?: No Weight loss?: No Fatigue?: No  Skin Skin rash/lesions?: No Itching?: No  Eyes Blurred vision?: No Double vision?: Yes  Ears/Nose/Throat Sore throat?: No Sinus problems?: Yes  Hematologic/Lymphatic Swollen glands?: No Easy bruising?: No  Cardiovascular Leg swelling?: No Chest pain?: No  Respiratory Cough?: No Shortness of breath?: No  Endocrine Excessive thirst?: No  Musculoskeletal Back pain?: No Joint pain?: No  Neurological Headaches?: No Dizziness?: No  Psychologic Depression?: No Anxiety?: No  Physical Exam: BP 129/73 mmHg  Pulse 68  Ht 5\' 9"  (1.753 m)  Wt 142 lb 11.2 oz (64.728 kg)  BMI 21.06 kg/m2  Constitutional: Well nourished. Alert and oriented, No acute distress. HEENT: [place] AT, moist mucus membranes. Trachea midline, no masses. Cardiovascular: No clubbing, cyanosis, or edema. Respiratory: Normal respiratory effort, no increased work of breathing. Skin: No rashes, bruises or suspicious lesions. Lymph: No cervical or inguinal  adenopathy. Neurologic: Grossly intact, no focal deficits, moving all 4 extremities. Psychiatric: Normal mood and affect.  Laboratory Data: Lab Results  Component Value Date   WBC 6.1 07/23/2015   HGB 14.7 07/23/2015   HCT 41.9 07/23/2015   MCV 97.4 07/23/2015   PLT 206 07/23/2015    Lab Results  Component Value Date   CREATININE 0.81 07/23/2015     Lab Results  Component Value Date   HGBA1C 6.1 10/31/2012    Lab Results  Component Value Date   TSH 6.14* 10/31/2012       Component Value Date/Time   CHOL 141 10/31/2012 0015   HDL 55 10/31/2012 0015  VLDL 15 10/31/2012 0015   LDLCALC 71 10/31/2012 0015    Lab Results  Component Value Date   AST 26 07/23/2015   Lab Results  Component Value Date   ALT 15* 07/23/2015     Pertinent Imaging: Results for [name], [name] (MRN [id number]) as of 12/22/2015 10:56  Ref. Range 12/22/2015 09:51  Scan Result Unknown 107     Assessment & Plan:    1. Urinary frequency:   Patient did not find any improvement on the Myrbetriq.  We discussed trying a anti-cholinergic, physical therapy, PTNS, Botox therapy and InterStim therapy.  Patient was mostly interested in PTNS and would like to pursue this therapy if it is not cost prohibitive.  We will contact his insurance and see what his cost would be for this therapy.   - BLADDER SCAN AMB NON-IMAGING  2. Nocturia:   I explained to the patient that nocturia is often multi-factorial and difficult to treat.  Sleeping disorders, heart conditions and peripheral vascular disease, diabetes,  enlarged prostate or urethral stricture causing bladder outlet obstruction and/or certain medications.  He may  benefit from fluid restrictions after 6:00 in the evening and voiding just prior to bedtime.  The patient may also benefit from a discussion with his primary care physician to see if he has risk factors for sleep apnea or other sleep disturbances and obtaining a sleep study.  3. BPH with  LUTS  - IPSS score is 23/6   - Continue conservative management, avoiding bladder irritants and timed voiding's  - Medication failure with Jalyn  - Medication failure with Myrbetriq   - Offered PTNS, need PA prior  Return for schedule PTNS, patient is going to call his insurance first to see if his insurance will cover .  These notes generated with voice recognition software. I apologize for typographical errors.  Michiel Cowboy, PA-C  [facility] [address], [address] [place], [place] [address] [phone]

## 2015-12-25 NOTE — Telephone Encounter (Signed)
Spoke to patient in reference to his PTNS charges and copayments. Patient states he wants to think about about getting the procedure and call us back and let us know if he wants to proceed.

## 2016-01-31 DIAGNOSIS — G4700 Insomnia, unspecified: Secondary | ICD-10-CM | POA: Diagnosis not present

## 2016-01-31 DIAGNOSIS — J310 Chronic rhinitis: Secondary | ICD-10-CM | POA: Diagnosis not present

## 2016-01-31 DIAGNOSIS — K219 Gastro-esophageal reflux disease without esophagitis: Secondary | ICD-10-CM | POA: Diagnosis not present

## 2016-02-08 DIAGNOSIS — G309 Alzheimer's disease, unspecified: Secondary | ICD-10-CM | POA: Diagnosis not present

## 2016-02-08 DIAGNOSIS — G4700 Insomnia, unspecified: Secondary | ICD-10-CM | POA: Diagnosis not present

## 2016-02-08 DIAGNOSIS — F0280 Dementia in other diseases classified elsewhere without behavioral disturbance: Secondary | ICD-10-CM | POA: Diagnosis not present

## 2016-02-08 DIAGNOSIS — Z0000 Encounter for general adult medical examination without abnormal findings: Secondary | ICD-10-CM | POA: Diagnosis not present

## 2016-02-08 DIAGNOSIS — N401 Enlarged prostate with lower urinary tract symptoms: Secondary | ICD-10-CM | POA: Diagnosis not present

## 2016-02-08 DIAGNOSIS — R413 Other amnesia: Secondary | ICD-10-CM | POA: Diagnosis not present

## 2016-03-05 DIAGNOSIS — R110 Nausea: Secondary | ICD-10-CM | POA: Diagnosis not present

## 2016-03-05 DIAGNOSIS — K219 Gastro-esophageal reflux disease without esophagitis: Secondary | ICD-10-CM | POA: Diagnosis not present

## 2016-03-15 DIAGNOSIS — R110 Nausea: Secondary | ICD-10-CM | POA: Diagnosis not present

## 2016-03-15 DIAGNOSIS — Z87898 Personal history of other specified conditions: Secondary | ICD-10-CM | POA: Diagnosis not present

## 2016-03-21 DIAGNOSIS — R110 Nausea: Secondary | ICD-10-CM | POA: Diagnosis not present

## 2016-04-02 ENCOUNTER — Encounter: Payer: Self-pay | Admitting: *Deleted

## 2016-04-03 ENCOUNTER — Ambulatory Visit: Payer: PPO | Admitting: Anesthesiology

## 2016-04-03 ENCOUNTER — Encounter: Payer: Self-pay | Admitting: *Deleted

## 2016-04-03 ENCOUNTER — Encounter
Admission: RE | Disposition: A | Discharge status: Home / Self Care | Payer: Self-pay | Source: Ambulatory Visit | Attending: Unknown Physician Specialty

## 2016-04-03 ENCOUNTER — Ambulatory Visit
Admission: RE | Admit: 2016-04-03 | Discharge: 2016-04-03 | Disposition: A | Discharge status: Home / Self Care | Payer: PPO | Source: Ambulatory Visit | Attending: Unknown Physician Specialty | Admitting: Unknown Physician Specialty

## 2016-04-03 DIAGNOSIS — R110 Nausea: Secondary | ICD-10-CM | POA: Diagnosis not present

## 2016-04-03 DIAGNOSIS — K2960 Other gastritis without bleeding: Secondary | ICD-10-CM | POA: Diagnosis not present

## 2016-04-03 DIAGNOSIS — K3189 Other diseases of stomach and duodenum: Secondary | ICD-10-CM | POA: Diagnosis not present

## 2016-04-03 DIAGNOSIS — N400 Enlarged prostate without lower urinary tract symptoms: Secondary | ICD-10-CM | POA: Diagnosis not present

## 2016-04-03 DIAGNOSIS — G309 Alzheimer's disease, unspecified: Secondary | ICD-10-CM | POA: Diagnosis not present

## 2016-04-03 DIAGNOSIS — F0280 Dementia in other diseases classified elsewhere without behavioral disturbance: Secondary | ICD-10-CM | POA: Insufficient documentation

## 2016-04-03 DIAGNOSIS — K449 Diaphragmatic hernia without obstruction or gangrene: Secondary | ICD-10-CM | POA: Insufficient documentation

## 2016-04-03 DIAGNOSIS — K2950 Unspecified chronic gastritis without bleeding: Secondary | ICD-10-CM | POA: Diagnosis not present

## 2016-04-03 DIAGNOSIS — K219 Gastro-esophageal reflux disease without esophagitis: Secondary | ICD-10-CM | POA: Insufficient documentation

## 2016-04-03 HISTORY — PX: ESOPHAGOGASTRODUODENOSCOPY (EGD) WITH PROPOFOL: SHX5813

## 2016-04-03 SURGERY — ESOPHAGOGASTRODUODENOSCOPY (EGD) WITH PROPOFOL
Anesthesia: General

## 2016-04-03 MED ORDER — PROPOFOL 500 MG/50ML IV EMUL
INTRAVENOUS | Status: DC | PRN
  Administered 2016-04-03: 50 ug/kg/min via INTRAVENOUS

## 2016-04-03 MED ORDER — SODIUM CHLORIDE 0.9 % IV SOLN: INTRAVENOUS | Status: DC

## 2016-04-03 MED ORDER — SODIUM CHLORIDE 0.9 % IV SOLN
INTRAVENOUS | Status: DC
  Administered 2016-04-03: 1000 mL via INTRAVENOUS

## 2016-04-03 NOTE — Transfer of Care (Signed)
Immediate Anesthesia Transfer of Care Note  Patient: [name]  Procedure(s) Performed: Procedure(s): ESOPHAGOGASTRODUODENOSCOPY (EGD) WITH PROPOFOL (N/A)  Patient Location: PACU  Anesthesia Type:General  Level of Consciousness: awake, alert  and oriented  Airway & Oxygen Therapy: Patient Spontanous Breathing and Patient connected to nasal cannula oxygen  Post-op Assessment: Report given to RN and Post -op Vital signs reviewed and stable  Post vital signs: Reviewed and stable  Last Vitals:  Vitals:   04/03/16 0956  BP: (!) 166/80  Pulse: 76  Resp: 18  Temp: 36.4 C    Last Pain:  Vitals:   04/03/16 0956  TempSrc: Tympanic         Complications: No apparent anesthesia complications

## 2016-04-03 NOTE — Anesthesia Preprocedure Evaluation (Signed)
Anesthesia Evaluation  Patient identified by MRN, date of birth, ID band Patient awake    Reviewed: Allergy & Precautions, NPO status , Patient's Chart, lab work & pertinent test results  History of Anesthesia Complications Negative for: history of anesthetic complications  Airway Mallampati: II  TM Distance: >3 FB Neck ROM: Full    Dental no notable dental hx.    Pulmonary neg pulmonary ROS, neg sleep apnea, neg COPD,    breath sounds clear to auscultation- rhonchi (-) wheezing      Cardiovascular Exercise Tolerance: Good (-) hypertension(-) angina(-) CAD and (-) Past MI  Rhythm:Regular Rate:Normal - Systolic murmurs and - Diastolic murmurs    Neuro/Psych negative neurological ROS  negative psych ROS   GI/Hepatic Neg liver ROS, GERD  ,  Endo/Other  negative endocrine ROSneg diabetes  Renal/GU negative Renal ROS     Musculoskeletal negative musculoskeletal ROS (+)   Abdominal (+) - obese,   Peds  Hematology negative hematology ROS (+)   Anesthesia Other Findings   Reproductive/Obstetrics                             Anesthesia Physical Anesthesia Plan  ASA: II  Anesthesia Plan: General   Post-op Pain Management:    Induction: Intravenous  Airway Management Planned: Natural Airway  Additional Equipment:   Intra-op Plan:   Post-operative Plan:   Informed Consent: I have reviewed the patients History and Physical, chart, labs and discussed the procedure including the risks, benefits and alternatives for the proposed anesthesia with the patient or authorized representative who has indicated his/her understanding and acceptance.   Dental advisory given  Plan Discussed with: Anesthesiologist and CRNA  Anesthesia Plan Comments:         Anesthesia Quick Evaluation

## 2016-04-03 NOTE — H&P (Signed)
Primary Care Physician:  Lauro RegulusANDERSON,MARSHALL W., MD Primary Gastroenterologist:  Dr. Mechele CollinElliott  Pre-Procedure History & Physical: HPI:  [name] is a 82 y.o. male is here for an endoscopy.   Past Medical History:  Diagnosis Date  . Acid reflux 11/29/2015   Last Assessment & Plan:  Taking the ppi with some benefit   . AD (Alzheimer's disease) 10/18/2015  . Benign paroxysmal positional nystagmus 10/18/2015  . Benign prostatic hyperplasia with urinary obstruction 11/29/2015   Last Assessment & Plan:  Worse off meds.   . Chronic rhinitis 11/29/2015   Last Assessment & Plan:  Doing well on antihistamines and nasal steriods.    . Complete rotator cuff rupture of left shoulder 10/05/2014  . Feeling bilious 09/21/2015   Overview:  Flairs at times, worse early 2017  Last Assessment & Plan:  Eating better on zofran, intolerant to scopolamine re constipation and urinary retention.      Past Surgical History:  Procedure Laterality Date  . CARPAL TUNNEL RELEASE  2015    Prior to Admission medications   Medication Sig Start Date End Date Taking? Authorizing Provider  esomeprazole (NEXIUM) 40 MG capsule Take by mouth.   Yes Historical Provider, MD  Magnesium 250 MG TABS Take 1 tablet by mouth daily.   Yes Historical Provider, MD  ondansetron (ZOFRAN-ODT) 8 MG disintegrating tablet Take 8 mg by mouth every 8 (eight) hours as needed for nausea or vomiting.   Yes Historical Provider, MD  Calcium Carb-Cholecalciferol (CALCIUM + D3 PO) Take by mouth.    Historical Provider, MD    Allergies as of 03/29/2016 - Review Complete 12/22/2015  Allergen Reaction Noted  . Iodine Shortness Of Breath 07/23/2015  . Oxycodone Nausea Only 07/23/2015  . Tramadol Nausea Only 07/23/2015  . Caffeine Palpitations 07/23/2015    Family History  Problem Relation Age of Onset  . Kidney cancer Neg Hx   . Prostate cancer Neg Hx     Social History   Social History  . Marital status: Single    Spouse name: N/A  .  Number of children: N/A  . Years of education: N/A   Occupational History  . Not on file.   Social History Main Topics  . Smoking status: Never Smoker  . Smokeless tobacco: Never Used  . Alcohol use No  . Drug use: No  . Sexual activity: Not on file   Other Topics Concern  . Not on file   Social History Narrative  . No narrative on file    Review of Systems: See HPI, otherwise negative ROS  Physical Exam: BP (!) 166/80   Pulse 76   Temp 97.5 F (36.4 C) (Tympanic)   Resp 18   Ht 5\' 9"  (1.753 m)   Wt 59.9 kg (132 lb)   SpO2 98%   BMI 19.49 kg/m  General:   Alert,  pleasant and cooperative in NAD Head:  Normocephalic and atraumatic. Neck:  Supple; no masses or thyromegaly. Lungs:  Clear throughout to auscultation.    Heart:  Regular rate and rhythm. Abdomen:  Soft, nontender and nondistended. Normal bowel sounds, without guarding, and without rebound.   Neurologic:  Alert and  oriented x4;  grossly normal neurologically.  Impression/Plan: [name] is here for an endoscopy to be performed for chronic nausea, abd pain.  Risks, benefits, limitations, and alternatives regarding  endoscopy have been reviewed with the patient.  Questions have been answered.  All parties agreeable.   Lynnae PrudeELLIOTT, Emir Nack, MD  04/03/2016,  10:56 AM

## 2016-04-03 NOTE — Op Note (Signed)
[facility] Gastroenterology Patient Name: Justin PhilipsGene Rowland Procedure Date: 04/03/2016 10:55 AM MRN: [id number] Account #: [id number] Date of Birth: 1933-01-06 Admit Type: Outpatient Age: [age] Room: [facility] ROOM 4 Gender: Male Note Status: Finalized Procedure:            Upper GI endoscopy Indications:          Nausea, Persistent vomiting of unknown cause Providers:            Scot Junobert T. Jahziel Sinn, MD Referring MD:         Marya AmslerMarshall W. Dareen PianoAnderson MD, MD (Referring MD) Medicines:            Propofol per Anesthesia Complications:        No immediate complications. Procedure:            Pre-Anesthesia Assessment:                       - After reviewing the risks and benefits, the patient                        was deemed in satisfactory condition to undergo the                        procedure.                       After obtaining informed consent, the endoscope was                        passed under direct vision. Throughout the procedure,                        the patient's blood pressure, pulse, and oxygen                        saturations were monitored continuously. The Endoscope                        was introduced through the mouth, and advanced to the                        second part of duodenum. The upper GI endoscopy was                        accomplished without difficulty. The patient tolerated                        the procedure well. Findings:      The examined esophagus was normal. GEJ 41cm      A small hiatal hernia was present.      The entire examined stomach was normal. Biopsies were taken with a cold       forceps for histology. Biopsies were taken with a cold forceps for       Helicobacter pylori testing.      The examined duodenum was normal. Biopsies were taken with a cold       forceps for histology. Impression:           - Normal esophagus.                       - Small hiatal hernia.                       -  Normal stomach. Biopsied.                  - Normal examined duodenum. Biopsied. Recommendation:       - Await pathology results.                       - follow up in office to schedule colonoscopy. Scot Junobert T Daron Stutz, MD 04/03/2016 11:13:56 AM This report has been signed electronically. Number of Addenda: 0 Note Initiated On: 04/03/2016 10:55 AM      [facility]

## 2016-04-03 NOTE — Anesthesia Postprocedure Evaluation (Signed)
Anesthesia Post Note  Patient: Jerusalem Mostafa  Procedure(s) Performed: Procedure(s) (LRB): ESOPHAGOGASTRODUODENOSCOPY (EGD) WITH PROPOFOL (N/A)  Patient location during evaluation: Endoscopy Anesthesia Type: General Level of consciousness: awake and alert and oriented Pain management: pain level controlled Vital Signs Assessment: post-procedure vital signs reviewed and stable Respiratory status: spontaneous breathing, nonlabored ventilation and respiratory function stable Cardiovascular status: blood pressure returned to baseline and stable Postop Assessment: no signs of nausea or vomiting Anesthetic complications: no    Last Vitals:  Vitals:   04/03/16 1130 04/03/16 1140  BP: (!) 115/58 (!) 151/77  Pulse:    Resp:    Temp:      Last Pain:  Vitals:   04/03/16 1110  TempSrc: Tympanic                 Kem Hensen

## 2016-04-04 ENCOUNTER — Encounter: Payer: Self-pay | Admitting: Unknown Physician Specialty

## 2016-04-04 LAB — SURGICAL PATHOLOGY

## 2016-04-06 ENCOUNTER — Emergency Department: Payer: PPO

## 2016-04-06 ENCOUNTER — Emergency Department
Admission: EM | Admit: 2016-04-06 | Discharge: 2016-04-06 | Disposition: A | Discharge status: Home / Self Care | Payer: PPO | Attending: Emergency Medicine | Admitting: Emergency Medicine

## 2016-04-06 DIAGNOSIS — G309 Alzheimer's disease, unspecified: Secondary | ICD-10-CM | POA: Diagnosis not present

## 2016-04-06 DIAGNOSIS — R1033 Periumbilical pain: Secondary | ICD-10-CM | POA: Insufficient documentation

## 2016-04-06 DIAGNOSIS — R1030 Lower abdominal pain, unspecified: Secondary | ICD-10-CM | POA: Diagnosis not present

## 2016-04-06 DIAGNOSIS — Z79899 Other long term (current) drug therapy: Secondary | ICD-10-CM | POA: Insufficient documentation

## 2016-04-06 DIAGNOSIS — R110 Nausea: Secondary | ICD-10-CM

## 2016-04-06 DIAGNOSIS — R197 Diarrhea, unspecified: Secondary | ICD-10-CM | POA: Diagnosis not present

## 2016-04-06 LAB — URINALYSIS COMPLETE WITH MICROSCOPIC (ARMC ONLY)
Bilirubin Urine: NEGATIVE
Glucose, UA: NEGATIVE mg/dL
HGB URINE DIPSTICK: NEGATIVE
Ketones, ur: NEGATIVE mg/dL
LEUKOCYTES UA: NEGATIVE
NITRITE: NEGATIVE
PH: 5 (ref 5.0–8.0)
PROTEIN: NEGATIVE mg/dL
SPECIFIC GRAVITY, URINE: 1.013 (ref 1.005–1.030)
SQUAMOUS EPITHELIAL / LPF: NONE SEEN

## 2016-04-06 LAB — LIPASE, BLOOD: LIPASE: 32 U/L (ref 11–51)

## 2016-04-06 LAB — COMPREHENSIVE METABOLIC PANEL
ALBUMIN: 4.9 g/dL (ref 3.5–5.0)
ALT: 13 U/L — ABNORMAL LOW (ref 17–63)
ANION GAP: 9 (ref 5–15)
AST: 23 U/L (ref 15–41)
Alkaline Phosphatase: 59 U/L (ref 38–126)
BILIRUBIN TOTAL: 0.3 mg/dL (ref 0.3–1.2)
BUN: 15 mg/dL (ref 6–20)
CHLORIDE: 94 mmol/L — AB (ref 101–111)
CO2: 30 mmol/L (ref 22–32)
Calcium: 9.5 mg/dL (ref 8.9–10.3)
Creatinine, Ser: 0.91 mg/dL (ref 0.61–1.24)
GFR calc Af Amer: >60 mL/min (ref >60)
GLUCOSE: 121 mg/dL — AB (ref 65–99)
POTASSIUM: 4.3 mmol/L (ref 3.5–5.1)
Sodium: 133 mmol/L — ABNORMAL LOW (ref 135–145)
TOTAL PROTEIN: 7.8 g/dL (ref 6.5–8.1)

## 2016-04-06 LAB — CBC
HEMATOCRIT: 45.9 % (ref 40.0–52.0)
HEMOGLOBIN: 16 g/dL (ref 13.0–18.0)
MCH: 33.7 pg (ref 26.0–34.0)
MCHC: 34.8 g/dL (ref 32.0–36.0)
MCV: 96.7 fL (ref 80.0–100.0)
Platelets: 243 10*3/uL (ref 150–440)
RBC: 4.75 MIL/uL (ref 4.40–5.90)
RDW: 13.1 % (ref 11.5–14.5)
WBC: 6.2 10*3/uL (ref 3.8–10.6)

## 2016-04-06 MED ORDER — DICYCLOMINE HCL 10 MG PO CAPS
20.0000 mg | ORAL_CAPSULE | Freq: Once | ORAL | Status: AC
  Administered 2016-04-06: 20 mg via ORAL
  Filled 2016-04-06: qty 2

## 2016-04-06 MED ORDER — BARIUM SULFATE 2.1 % PO SUSP: 450.0000 mL | ORAL | Status: AC

## 2016-04-06 MED ORDER — ONDANSETRON 4 MG PO TBDP: 4.0000 mg | ORAL_TABLET | Freq: Three times a day (TID) | ORAL | 0 refills | Status: AC | PRN

## 2016-04-06 MED ORDER — DICYCLOMINE HCL 20 MG PO TABS: 20.0000 mg | ORAL_TABLET | Freq: Three times a day (TID) | ORAL | 0 refills | Status: AC | PRN

## 2016-04-06 NOTE — ED Notes (Signed)
  Reviewed d/c instructions, follow-up care, and prescriptions with pt. Pt verbalized understanding 

## 2016-04-06 NOTE — ED Triage Notes (Signed)
Pt reports lower abd cramping that started at 1230 today. Denies Nausea and vomiting. States he has had diarrhea and constipation. States he had an endoscopy on 11/1 was WNL besides small Hiatal Hernia. Recommended colonoscopy.

## 2016-04-06 NOTE — ED Notes (Signed)
MD Stafford at bedside. 

## 2016-04-06 NOTE — ED Provider Notes (Signed)
[facility] Emergency Department Provider Note  ____________________________________________  Time seen: Approximately 7:05 PM  I have reviewed the triage vital signs and the nursing notes.   HISTORY  Chief Complaint Abdominal Pain    HPI Dayshaun Criss Alvinerince is a 82 y.o. male who complains of lower abdominal pain since 12:30 PM today. Is associated with some diarrhea. Pain is crampy, colicky and intermittent, nonradiating. No lower extremity weakness. No difficulty urinating.     Past Medical History:  Diagnosis Date  . Acid reflux 11/29/2015   Last Assessment & Plan:  Taking the ppi with some benefit   . AD (Alzheimer's disease) 10/18/2015  . Benign paroxysmal positional nystagmus 10/18/2015  . Benign prostatic hyperplasia with urinary obstruction 11/29/2015   Last Assessment & Plan:  Worse off meds.   . Chronic rhinitis 11/29/2015   Last Assessment & Plan:  Doing well on antihistamines and nasal steriods.    . Complete rotator cuff rupture of left shoulder 10/05/2014  . Feeling bilious 09/21/2015   Overview:  Flairs at times, worse early 2017  Last Assessment & Plan:  Eating better on zofran, intolerant to scopolamine re constipation and urinary retention.       Patient Active Problem List   Diagnosis Date Noted  . Benign prostatic hyperplasia with urinary obstruction 11/29/2015  . Acid reflux 11/29/2015  . Cannot sleep 11/29/2015  . Chronic rhinitis 11/29/2015  . AD (Alzheimer's disease) 10/18/2015  . Benign paroxysmal positional nystagmus 10/18/2015  . Difficulty sleeping 10/18/2015  . Feeling bilious 09/21/2015  . Complete rotator cuff rupture of left shoulder 10/05/2014     Past Surgical History:  Procedure Laterality Date  . CARPAL TUNNEL RELEASE  2015  . ESOPHAGOGASTRODUODENOSCOPY (EGD) WITH PROPOFOL N/A 04/03/2016   Procedure: ESOPHAGOGASTRODUODENOSCOPY (EGD) WITH PROPOFOL;  Surgeon: Scot Junobert T Elliott, MD;  [facility];  Service:  Endoscopy;  Laterality: N/A;     Prior to Admission medications   Medication Sig Start Date End Date Taking? Authorizing Provider  Calcium Carb-Cholecalciferol (CALCIUM + D3 PO) Take by mouth.    Historical Provider, MD  dicyclomine (BENTYL) 20 MG tablet Take 1 tablet (20 mg total) by mouth 3 (three) times daily as needed for spasms. 04/06/16   Sharman CheekPhillip Dameon Soltis, MD  esomeprazole (NEXIUM) 40 MG capsule Take by mouth.    Historical Provider, MD  Magnesium 250 MG TABS Take 1 tablet by mouth daily.    Historical Provider, MD  ondansetron (ZOFRAN ODT) 4 MG disintegrating tablet Take 1 tablet (4 mg total) by mouth every 8 (eight) hours as needed for nausea or vomiting. 04/06/16   Sharman CheekPhillip Chaquita Basques, MD  ondansetron (ZOFRAN-ODT) 8 MG disintegrating tablet Take 8 mg by mouth every 8 (eight) hours as needed for nausea or vomiting.    Historical Provider, MD     Allergies Iodine; Oxycodone; Tramadol; and Caffeine   Family History  Problem Relation Age of Onset  . Kidney cancer Neg Hx   . Prostate cancer Neg Hx     Social History Social History  Substance Use Topics  . Smoking status: Never Smoker  . Smokeless tobacco: Never Used  . Alcohol use No    Review of Systems  Constitutional:   No fever or chills.   Cardiovascular:   No chest pain. Respiratory:   No dyspnea or cough. Gastrointestinal:   Positive abdominal pain. No vomiting. Irregular bowel movements with alternating diarrhea and constipation.  Genitourinary:   Negative for dysuria or difficulty urinating.  10-point ROS otherwise  negative.  ____________________________________________   PHYSICAL EXAM:  VITAL SIGNS: ED Triage Vitals  Enc Vitals Group     BP 04/06/16 1513 (!) 159/73     Pulse Rate 04/06/16 1513 (!) 55     Resp 04/06/16 1513 16     Temp 04/06/16 1513 97.6 F (36.4 C)     Temp Source 04/06/16 1513 Oral     SpO2 04/06/16 1513 98 %     Weight 04/06/16 1514 146 lb (66.2 kg)     Height 04/06/16 1514 5\' 9"   (1.753 m)     Head Circumference --      Peak Flow --      Pain Score 04/06/16 1514 6     Pain Loc --      Pain Edu? --      Excl. in GC? --     Vital signs reviewed, nursing assessments reviewed.   Constitutional:   Alert and oriented. Well appearing and in no distress. Eyes:   No scleral icterus. No conjunctival pallor. PERRL. EOMI.  No nystagmus. ENT   Head:   Normocephalic and atraumatic.   Nose:   No congestion/rhinnorhea. No septal hematoma   Mouth/Throat:   MMM, no pharyngeal erythema. No peritonsillar mass.    Neck:   No stridor. No SubQ emphysema. No meningismus. Hematological/Lymphatic/Immunilogical:   No cervical lymphadenopathy. Cardiovascular:   RRR. Symmetric bilateral radial and DP pulses.  No murmurs.  Respiratory:   Normal respiratory effort without tachypnea nor retractions. Breath sounds are clear and equal bilaterally. No wheezes/rales/rhonchi. Gastrointestinal:   Soft with suprapubic tenderness. Non distended. There is no CVA tenderness.  No rebound, rigidity, or guarding. No aortic bruit or pulsatile mass Genitourinary:   deferred Musculoskeletal:   Nontender with normal range of motion in all extremities. No joint effusions.  No lower extremity tenderness.  No edema. Neurologic:   Normal speech and language.  CN 2-10 normal. Motor grossly intact. No gross focal neurologic deficits are appreciated.  Skin:    Skin is warm, dry and intact. No rash noted.  No petechiae, purpura, or bullae.  ____________________________________________    LABS (pertinent positives/negatives) (all labs ordered are listed, but only abnormal results are displayed) Labs Reviewed  COMPREHENSIVE METABOLIC PANEL - Abnormal; Notable for the following:       Result Value   Sodium 133 (*)    Chloride 94 (*)    Glucose, Bld 121 (*)    ALT 13 (*)    All other components within normal limits  URINALYSIS COMPLETEWITH MICROSCOPIC ([facility] ONLY) - Abnormal; Notable for the  following:    Color, Urine YELLOW (*)    APPearance CLEAR (*)    Bacteria, UA RARE (*)    All other components within normal limits  LIPASE, BLOOD  CBC   ____________________________________________   EKG    ____________________________________________    RADIOLOGY  CT abdomen pelvis unremarkable  ____________________________________________   PROCEDURES Procedures  ____________________________________________   INITIAL IMPRESSION / ASSESSMENT AND PLAN / ED COURSE  Pertinent labs & imaging results that were available during my care of the patient were reviewed by me and considered in my medical decision making (see chart for details).  Patient presents with abdominal pain which he rates as severe. He does not appear to be in distress however. He does have some suprapubic tenderness which correlates to his reported pain. Low suspicion for mesenteric ischemia. We'll get a CT scan without contrast due to his reported history of severe reaction to "  iodine" that he had during his CT study many years ago. The patient is not able to recall any specific details about this. Overall the patient is ill-appearing and I suspect he will be suitable for outpatient follow-up unless there any severe findings on CT.    ----------------------------------------- 8:27 PM on 04/06/2016 ----------------------------------------- CT unremarkable. Vitals remain stable. Patient calm and comfortable. We'll discharge home with Zofran, oral hydration instructions, return precautions, follow up with primary care in 2 days.      Clinical Course   ____________________________________________   FINAL CLINICAL IMPRESSION(S) / ED DIAGNOSES  Final diagnoses:  Periumbilical abdominal pain  Nausea       Portions of this note were generated with dragon dictation software. Dictation errors may occur despite best attempts at proofreading.    Sharman CheekPhillip Lavona Norsworthy, MD 04/06/16 2027

## 2016-04-09 ENCOUNTER — Other Ambulatory Visit: Payer: Self-pay | Admitting: Physician Assistant

## 2016-04-09 DIAGNOSIS — R1033 Periumbilical pain: Secondary | ICD-10-CM | POA: Diagnosis not present

## 2016-04-09 DIAGNOSIS — F419 Anxiety disorder, unspecified: Secondary | ICD-10-CM | POA: Diagnosis not present

## 2016-04-09 DIAGNOSIS — K219 Gastro-esophageal reflux disease without esophagitis: Secondary | ICD-10-CM | POA: Diagnosis not present

## 2016-04-09 DIAGNOSIS — R110 Nausea: Secondary | ICD-10-CM

## 2016-04-10 ENCOUNTER — Telehealth: Payer: Self-pay | Admitting: Urology

## 2016-04-10 NOTE — Telephone Encounter (Signed)
Pt saw Carollee HerterShannon in July.  He wants to know if his prostate gland was enlarged?  He wants to know if it could be pushing on his bladder?  He has urgency and frequency.  He doesn't want to end up with prostate cancer.  I didn't see where a PSA was done.  Please advise.  He came into office today.

## 2016-04-10 NOTE — Telephone Encounter (Signed)
Patient's prostate is enlarged.  His PVR's have been low in the office.  His prostate is not causing obstruction, but it could be causing irritation to the bladder.  The bladder also becomes more irrtiated as you age.  He was on Jalyn which can reduce the size of his prostate.  He did not see any improvement in his irritative symptoms, so he discontinued the [place].  We also tried Myrbetriq which was not helpful in controlling his symptoms.  He was offered PNE, Botox and PTNS, but he declined.    We do not recommend screening for prostate cancer past the age of 80 as most men will have some form of prostate cancer at this stage in their life.  These individuals will most likely die of other medical issues rather than the prostate cancer.    I tired to contact the patient, but I had to leave a message to call back.

## 2016-04-12 NOTE — Telephone Encounter (Signed)
Patient came into the office and said no one called him back regarding his questions. I read him your response and told him that you did call him and left him a message on the 8th. He said fine if he was going to die of something he would like to know what it was going to be??? He was satisfied with the response.   Marcelino DusterMichelle

## 2016-04-12 NOTE — Telephone Encounter (Signed)
Does he want an appointment?

## 2016-04-15 NOTE — Telephone Encounter (Signed)
LMOM

## 2016-04-17 ENCOUNTER — Other Ambulatory Visit: Payer: Self-pay | Admitting: Student

## 2016-04-17 DIAGNOSIS — R6881 Early satiety: Secondary | ICD-10-CM | POA: Diagnosis not present

## 2016-04-17 DIAGNOSIS — R110 Nausea: Secondary | ICD-10-CM | POA: Diagnosis not present

## 2016-04-17 DIAGNOSIS — R630 Anorexia: Secondary | ICD-10-CM

## 2016-04-18 ENCOUNTER — Ambulatory Visit
Admission: RE | Admit: 2016-04-18 | Discharge: 2016-04-18 | Disposition: A | Discharge status: Home / Self Care | Payer: PPO | Source: Ambulatory Visit | Attending: Physician Assistant | Admitting: Physician Assistant

## 2016-04-18 ENCOUNTER — Encounter
Admission: RE | Admit: 2016-04-18 | Discharge: 2016-04-18 | Disposition: A | Discharge status: Home / Self Care | Payer: PPO | Source: Ambulatory Visit | Attending: Physician Assistant | Admitting: Physician Assistant

## 2016-04-18 DIAGNOSIS — R1033 Periumbilical pain: Secondary | ICD-10-CM

## 2016-04-18 DIAGNOSIS — R110 Nausea: Secondary | ICD-10-CM | POA: Diagnosis not present

## 2016-04-18 DIAGNOSIS — R1013 Epigastric pain: Secondary | ICD-10-CM | POA: Diagnosis not present

## 2016-04-18 MED ORDER — TECHNETIUM TC 99M MEBROFENIN IV KIT
5.0000 | PACK | Freq: Once | INTRAVENOUS | Status: AC | PRN
  Administered 2016-04-18: 5.07 via INTRAVENOUS

## 2016-04-29 ENCOUNTER — Telehealth: Payer: Self-pay | Admitting: Urology

## 2016-04-29 DIAGNOSIS — N401 Enlarged prostate with lower urinary tract symptoms: Secondary | ICD-10-CM

## 2016-04-29 MED ORDER — DUTASTERIDE-TAMSULOSIN HCL 0.5-0.4 MG PO CAPS: 1.0000 | ORAL_CAPSULE | Freq: Every day | ORAL | 2 refills | Status: AC

## 2016-04-29 NOTE — Telephone Encounter (Signed)
He may have a three month supply and then we need to see him in the office.

## 2016-04-29 NOTE — Telephone Encounter (Signed)
Patient is requesting a prescription for Jalyn to be called into the [facility] [address].  He said that he stopped taking it for BPH, but would like to try it again.

## 2016-04-29 NOTE — Telephone Encounter (Signed)
LMOM- medication sent to pharmacy. Will need OV in 4mo.

## 2016-05-03 DIAGNOSIS — R6881 Early satiety: Secondary | ICD-10-CM | POA: Diagnosis not present

## 2016-05-03 DIAGNOSIS — R630 Anorexia: Secondary | ICD-10-CM | POA: Diagnosis not present

## 2016-05-10 ENCOUNTER — Encounter
Admission: RE | Admit: 2016-05-10 | Discharge: 2016-05-10 | Disposition: A | Discharge status: Home / Self Care | Payer: PPO | Source: Ambulatory Visit | Attending: Student | Admitting: Student

## 2016-05-10 ENCOUNTER — Ambulatory Visit: Payer: PPO

## 2016-05-10 DIAGNOSIS — R630 Anorexia: Secondary | ICD-10-CM | POA: Diagnosis not present

## 2016-05-10 DIAGNOSIS — R110 Nausea: Secondary | ICD-10-CM

## 2016-05-10 DIAGNOSIS — R6881 Early satiety: Secondary | ICD-10-CM | POA: Diagnosis not present

## 2016-05-10 DIAGNOSIS — R634 Abnormal weight loss: Secondary | ICD-10-CM | POA: Diagnosis not present

## 2016-05-10 MED ORDER — TECHNETIUM TC 99M SULFUR COLLOID FILTERED
2.0000 | Freq: Once | INTRAVENOUS | Status: AC | PRN
  Administered 2016-05-10: 2.51 via INTRADERMAL

## 2016-05-13 ENCOUNTER — Other Ambulatory Visit: Payer: PPO

## 2016-08-08 DIAGNOSIS — F5101 Primary insomnia: Secondary | ICD-10-CM | POA: Diagnosis not present

## 2016-08-08 DIAGNOSIS — R110 Nausea: Secondary | ICD-10-CM | POA: Diagnosis not present

## 2016-08-08 DIAGNOSIS — N401 Enlarged prostate with lower urinary tract symptoms: Secondary | ICD-10-CM | POA: Diagnosis not present

## 2016-08-08 DIAGNOSIS — K219 Gastro-esophageal reflux disease without esophagitis: Secondary | ICD-10-CM | POA: Diagnosis not present

## 2017-02-06 DIAGNOSIS — K219 Gastro-esophageal reflux disease without esophagitis: Secondary | ICD-10-CM | POA: Diagnosis not present

## 2017-02-06 DIAGNOSIS — E7800 Pure hypercholesterolemia, unspecified: Secondary | ICD-10-CM | POA: Diagnosis not present

## 2017-02-06 DIAGNOSIS — Z0000 Encounter for general adult medical examination without abnormal findings: Secondary | ICD-10-CM | POA: Diagnosis not present

## 2017-02-06 DIAGNOSIS — R110 Nausea: Secondary | ICD-10-CM | POA: Diagnosis not present

## 2017-02-21 DIAGNOSIS — Z23 Encounter for immunization: Secondary | ICD-10-CM | POA: Diagnosis not present

## 2017-02-21 DIAGNOSIS — F5101 Primary insomnia: Secondary | ICD-10-CM | POA: Diagnosis not present

## 2017-02-21 DIAGNOSIS — Z0000 Encounter for general adult medical examination without abnormal findings: Secondary | ICD-10-CM | POA: Diagnosis not present

## 2017-02-21 DIAGNOSIS — R110 Nausea: Secondary | ICD-10-CM | POA: Diagnosis not present

## 2017-03-27 DIAGNOSIS — R110 Nausea: Secondary | ICD-10-CM | POA: Diagnosis not present

## 2017-03-27 DIAGNOSIS — K591 Functional diarrhea: Secondary | ICD-10-CM | POA: Diagnosis not present

## 2017-09-22 IMAGING — CT CT ABD-PELV W/O CM
2 of 4 series · 15 of 46 positions shown, 17 images · non-contrast
Comparison: 08/08/2015 CT abdomen/ pelvis.

CLINICAL DATA: Lower abdominal pain/cramping for 1 day.

EXAM:
CT ABDOMEN AND PELVIS WITHOUT CONTRAST
TECHNIQUE: Multidetector CT imaging of the abdomen and pelvis was performed
following the standard protocol without IV contrast.

[Series 2: routine soft tissue · axial · 0.71mm/px · z∈[-483,-88]mm · 12 of 87 slices shown, 14 images]
[im 4/87  soft-tissue]
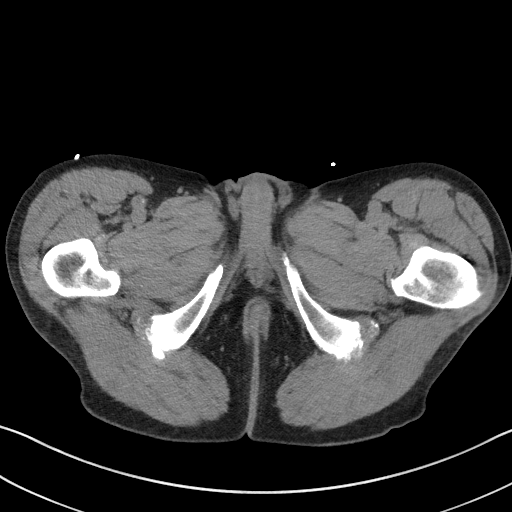
[im 4/87  bone]
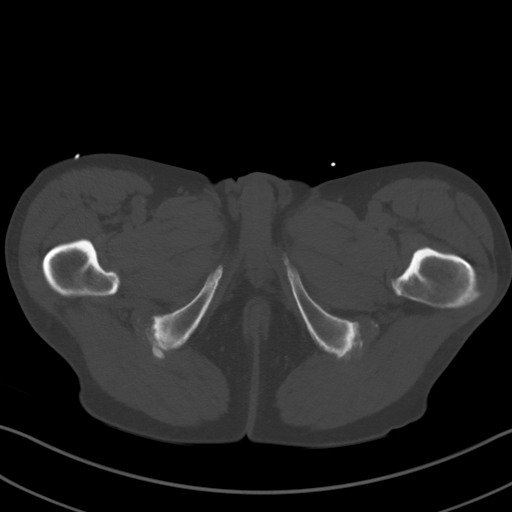
[im 12/87  soft-tissue]
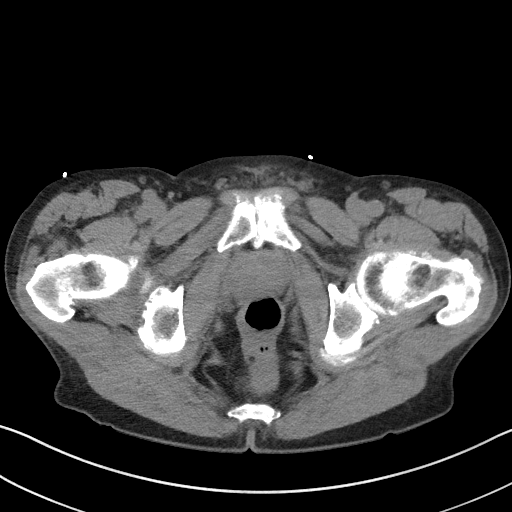
[im 19/87  soft-tissue]
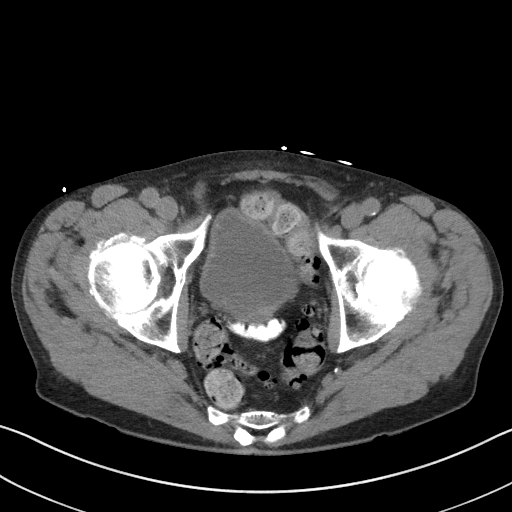
[im 27/87  soft-tissue]
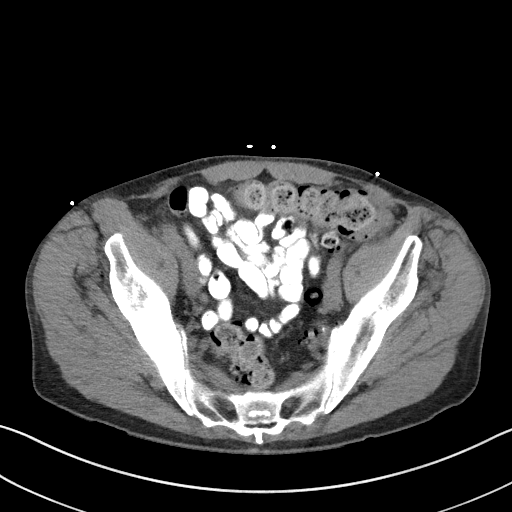
[im 34/87  soft-tissue]
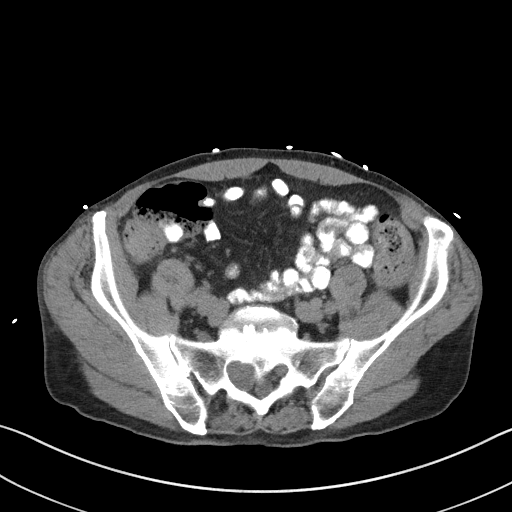
[im 42/87  soft-tissue]
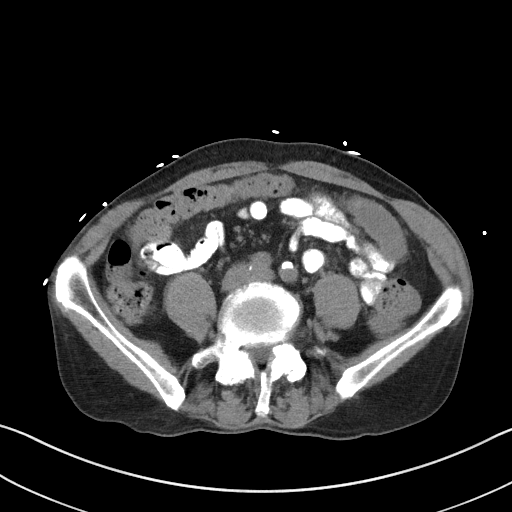
[im 45/87  soft-tissue]
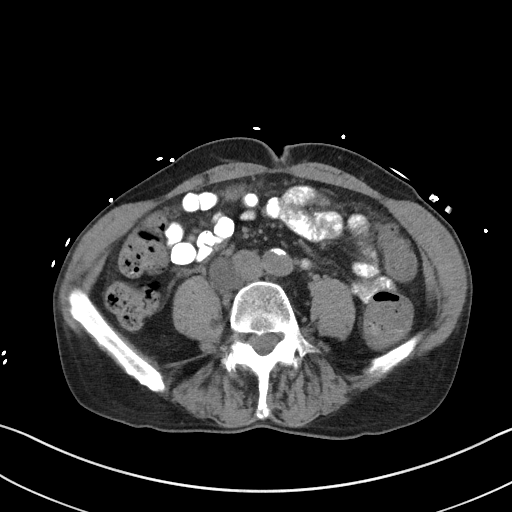
[im 53/87  soft-tissue]
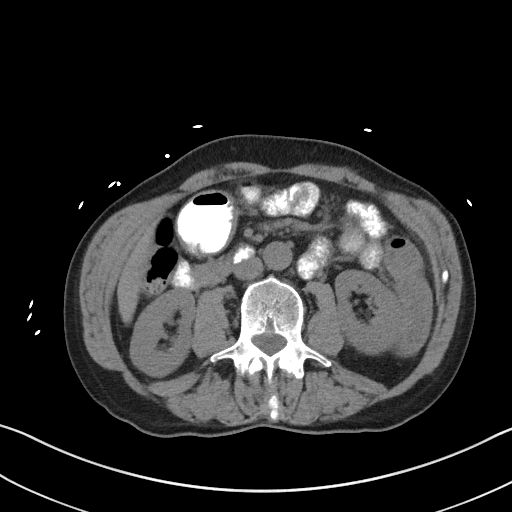
[im 60/87  soft-tissue]
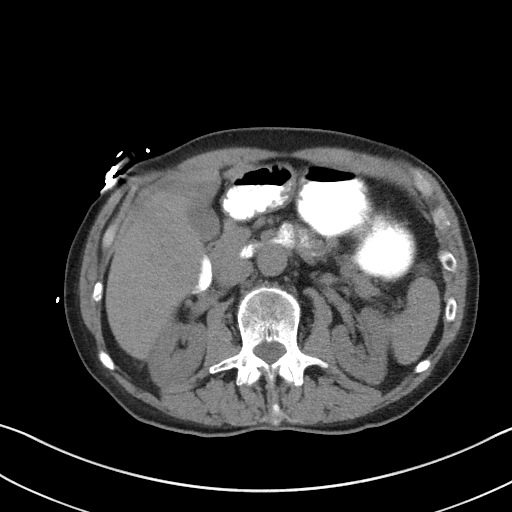
[im 60/87  bone]
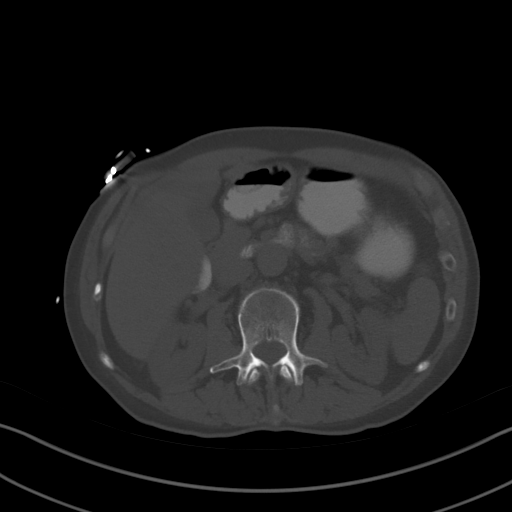
[im 68/87  soft-tissue]
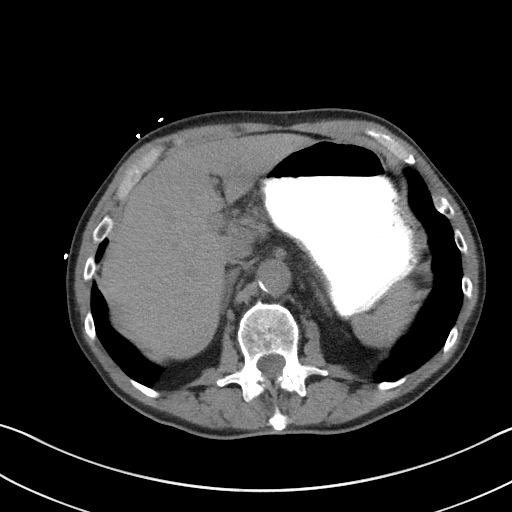
[im 75/87  soft-tissue]
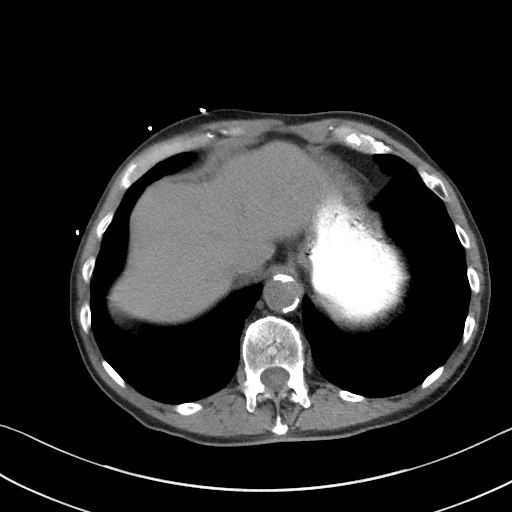
[im 83/87  soft-tissue]
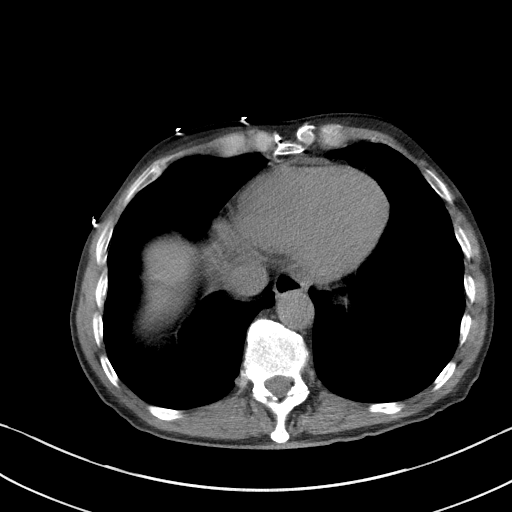

[Series 5: coronal st · coronal · 0.60mm/px · 3 of 81 slices shown]
[im 27/81  soft-tissue]
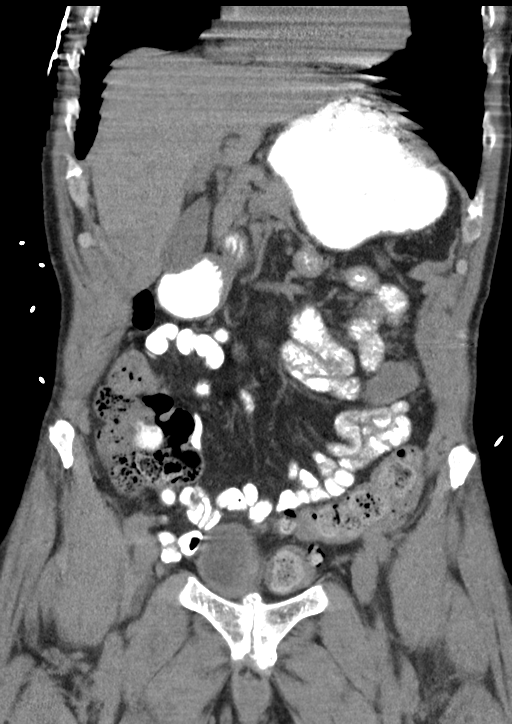
[im 36/81  soft-tissue]
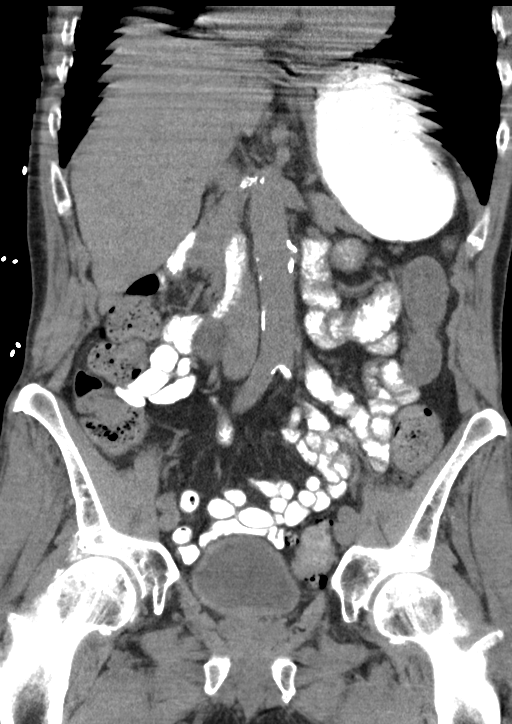
[im 45/81  soft-tissue]
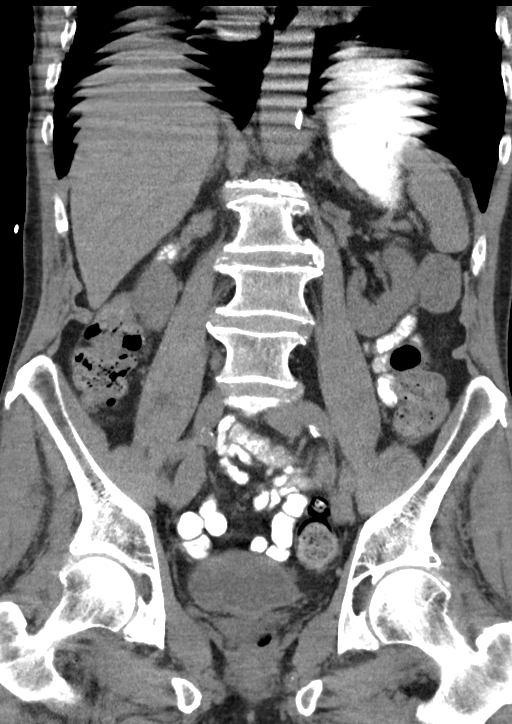

[15 of 46 positions shown; findings below may reference images not displayed]

FINDINGS: Motion degraded scan.

Lower chest: No significant pulmonary nodules or acute consolidative
airspace disease. Trace pericardial effusion/thickening, stable.

Hepatobiliary: Normal liver with no liver mass. Normal gallbladder
with no radiopaque cholelithiasis. No biliary ductal dilatation.

Pancreas: Normal, with no mass or duct dilation.

Spleen: Normal size. No mass.

Adrenals/Urinary Tract: Normal adrenals. No hydronephrosis. A few
punctate calcifications in the interpolar kidneys are nonspecific,
either vascular or nonobstructing punctate stones. No contour
deforming renal mass. Normal caliber ureters, with no ureteral
stones. Nondistended bladder with no definite bladder wall
thickening and no bladder stones.

Stomach/Bowel: Grossly normal stomach. Normal caliber small bowel
with no small bowel wall thickening. Normal appendix. Mild sigmoid
diverticulosis, with no large bowel wall thickening or pericolonic
fat stranding.

Vascular/Lymphatic: Atherosclerotic nonaneurysmal abdominal aorta.
Simple 2.7 cm cystic structure in the right lower retroperitoneum
between the lower IVC and right psoas muscle (series 2/ image 41),
stable since 08/08/2015. No pathologically enlarged lymph nodes in
the abdomen or pelvis.

Reproductive: Stable mildly enlarged prostate.

Other: No pneumoperitoneum, ascites or focal fluid collection.

Musculoskeletal: No aggressive appearing focal osseous lesions.
Moderate thoracolumbar spondylosis.
IMPRESSION: 1. Limited motion degraded unenhanced CT study.
2. No acute abnormality. No evidence of bowel obstruction or acute
bowel inflammation. Normal appendix. Mild sigmoid diverticulosis,
with no evidence of acute diverticulitis.
3. Punctate vascular calcifications versus nonobstructing stones in
the interpolar kidneys bilaterally. No hydronephrosis.
4. Stable simple cystic structure in the right lower
retroperitoneum, favor a lymphocele or developmental cyst.
5. Additional findings include aortic atherosclerosis and mildly
enlarged prostate.
